# Patient Record
Sex: Male | Born: 1940
Health system: Southern US, Community
[De-identification: ages and names within clinical notes are randomized; demographics above are authoritative.]

## PROBLEM LIST (undated history)

## (undated) DIAGNOSIS — C61 Malignant neoplasm of prostate: Secondary | ICD-10-CM

## (undated) DIAGNOSIS — E119 Type 2 diabetes mellitus without complications: Secondary | ICD-10-CM

## (undated) DIAGNOSIS — K219 Gastro-esophageal reflux disease without esophagitis: Secondary | ICD-10-CM

## (undated) DIAGNOSIS — M1990 Unspecified osteoarthritis, unspecified site: Secondary | ICD-10-CM

## (undated) DIAGNOSIS — H409 Unspecified glaucoma: Secondary | ICD-10-CM

## (undated) DIAGNOSIS — I10 Essential (primary) hypertension: Secondary | ICD-10-CM

## (undated) DIAGNOSIS — I639 Cerebral infarction, unspecified: Secondary | ICD-10-CM

## (undated) DIAGNOSIS — C679 Malignant neoplasm of bladder, unspecified: Secondary | ICD-10-CM

## (undated) HISTORY — DX: Essential (primary) hypertension: I10

## (undated) HISTORY — DX: Malignant neoplasm of prostate: C61

## (undated) HISTORY — DX: Type 2 diabetes mellitus without complications: E11.9

## (undated) HISTORY — DX: Malignant neoplasm of bladder, unspecified: C67.9

## (undated) HISTORY — DX: Cerebral infarction, unspecified: I63.9

---

## 1994-04-22 HISTORY — PX: BACK SURGERY: SHX140

## 1998-04-22 HISTORY — PX: PROSTATE SURGERY: SHX751

## 1999-04-23 HISTORY — PX: COLONOSCOPY: SHX174

## 1999-12-31 ENCOUNTER — Other Ambulatory Visit: Admission: RE | Admit: 1999-12-31 | Discharge: 1999-12-31 | Payer: Self-pay | Admitting: Urology

## 2000-08-04 ENCOUNTER — Other Ambulatory Visit: Admission: RE | Admit: 2000-08-04 | Discharge: 2000-08-04 | Payer: Self-pay | Admitting: Urology

## 2000-08-04 ENCOUNTER — Encounter (INDEPENDENT_AMBULATORY_CARE_PROVIDER_SITE_OTHER): Payer: Self-pay | Admitting: Specialist

## 2001-01-12 ENCOUNTER — Other Ambulatory Visit: Admission: RE | Admit: 2001-01-12 | Discharge: 2001-01-12 | Payer: Self-pay | Admitting: Urology

## 2002-12-08 ENCOUNTER — Ambulatory Visit (HOSPITAL_COMMUNITY): Admission: RE | Admit: 2002-12-08 | Discharge: 2002-12-08 | Payer: Self-pay | Admitting: Internal Medicine

## 2002-12-08 HISTORY — PX: COLONOSCOPY: SHX174

## 2003-12-29 ENCOUNTER — Ambulatory Visit (HOSPITAL_COMMUNITY): Admission: RE | Admit: 2003-12-29 | Discharge: 2003-12-29 | Payer: Self-pay | Admitting: Family Medicine

## 2005-02-11 ENCOUNTER — Observation Stay (HOSPITAL_COMMUNITY): Admission: EM | Admit: 2005-02-11 | Discharge: 2005-02-13 | Payer: Self-pay | Admitting: Emergency Medicine

## 2005-02-12 ENCOUNTER — Ambulatory Visit: Payer: Self-pay | Admitting: *Deleted

## 2005-02-13 ENCOUNTER — Ambulatory Visit: Payer: Self-pay | Admitting: Cardiovascular Disease

## 2005-02-27 ENCOUNTER — Ambulatory Visit: Payer: Self-pay | Admitting: *Deleted

## 2005-04-01 ENCOUNTER — Encounter (INDEPENDENT_AMBULATORY_CARE_PROVIDER_SITE_OTHER): Payer: Self-pay | Admitting: Specialist

## 2005-04-01 ENCOUNTER — Ambulatory Visit (HOSPITAL_BASED_OUTPATIENT_CLINIC_OR_DEPARTMENT_OTHER): Admission: RE | Admit: 2005-04-01 | Discharge: 2005-04-01 | Payer: Self-pay | Admitting: Urology

## 2005-04-01 ENCOUNTER — Ambulatory Visit (HOSPITAL_COMMUNITY): Admission: RE | Admit: 2005-04-01 | Discharge: 2005-04-01 | Payer: Self-pay | Admitting: Urology

## 2005-06-10 ENCOUNTER — Encounter (INDEPENDENT_AMBULATORY_CARE_PROVIDER_SITE_OTHER): Payer: Self-pay | Admitting: Specialist

## 2005-06-10 ENCOUNTER — Inpatient Hospital Stay (HOSPITAL_COMMUNITY): Admission: RE | Admit: 2005-06-10 | Discharge: 2005-06-13 | Payer: Self-pay | Admitting: Urology

## 2005-09-12 ENCOUNTER — Ambulatory Visit: Admission: RE | Admit: 2005-09-12 | Discharge: 2005-12-11 | Payer: Self-pay | Admitting: *Deleted

## 2005-09-26 ENCOUNTER — Encounter (HOSPITAL_COMMUNITY): Admission: RE | Admit: 2005-09-26 | Discharge: 2005-12-25 | Payer: Self-pay | Admitting: *Deleted

## 2005-10-09 ENCOUNTER — Ambulatory Visit (HOSPITAL_COMMUNITY): Admission: RE | Admit: 2005-10-09 | Discharge: 2005-10-09 | Payer: Self-pay | Admitting: *Deleted

## 2005-11-10 ENCOUNTER — Emergency Department (HOSPITAL_COMMUNITY): Admission: EM | Admit: 2005-11-10 | Discharge: 2005-11-10 | Payer: Self-pay | Admitting: Emergency Medicine

## 2006-09-12 ENCOUNTER — Ambulatory Visit (HOSPITAL_COMMUNITY): Admission: RE | Admit: 2006-09-12 | Discharge: 2006-09-12 | Payer: Self-pay | Admitting: Urology

## 2006-09-12 ENCOUNTER — Encounter (INDEPENDENT_AMBULATORY_CARE_PROVIDER_SITE_OTHER): Payer: Self-pay | Admitting: Urology

## 2007-01-02 ENCOUNTER — Ambulatory Visit (HOSPITAL_BASED_OUTPATIENT_CLINIC_OR_DEPARTMENT_OTHER): Admission: RE | Admit: 2007-01-02 | Discharge: 2007-01-02 | Payer: Self-pay | Admitting: Urology

## 2007-01-02 ENCOUNTER — Encounter (INDEPENDENT_AMBULATORY_CARE_PROVIDER_SITE_OTHER): Payer: Self-pay | Admitting: Urology

## 2007-01-22 IMAGING — PT NM PET TUM IMG SKULL BASE T - THIGH
4 series · 25 of 25 positions shown · non-contrast
Comparison: None.

CLINICAL DATA: Prostate carcinoma.  Right adrenal mass seen on prior ProstaScint scan.  
FDG PET-CT TUMOR IMAGING (SKULL BASE TO THIGHS):
Fasting Blood Glucose:  95
TECHNIQUE: 18.2 mCi F-18 FDG were administered via right arm.  Full ring PET imaging was performed from the skull base through the mid-thighs 60 minutes after injection.  CT data was obtained and used for attenuation correction and anatomic localization only.  (This was not acquired as a diagnostic CT examination.)

[Series 1: pet ac · axial · 3.3mm · 4.69mm/px · z∈[-870,+0]mm · 9 of 267 slices shown]
[im 1/267]
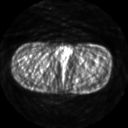
[im 34/267]
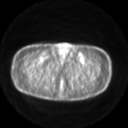
[im 67/267]
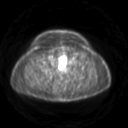
[im 100/267]
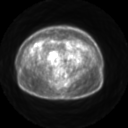
[im 134/267]
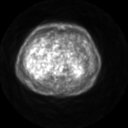
[im 167/267]
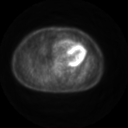
[im 200/267]
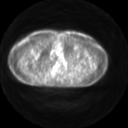
[im 233/267]
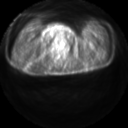
[im 267/267]
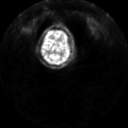

[Series 2: pet nac · axial · 3.3mm · 4.69mm/px · z∈[-870,+0]mm · 8 of 267 slices shown]
[im 1/267]
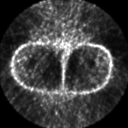
[im 39/267]
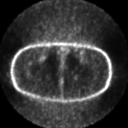
[im 77/267]
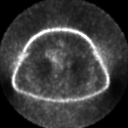
[im 115/267]
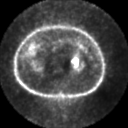
[im 153/267]
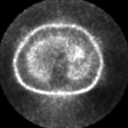
[im 191/267]
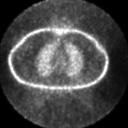
[im 229/267]
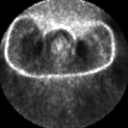
[im 267/267]
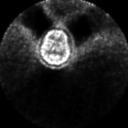

[Series 2: ct images · axial · 3.8mm · 0.98mm/px · z∈[-867,+0]mm · 7 of 235 slices shown]
[im 1/235]
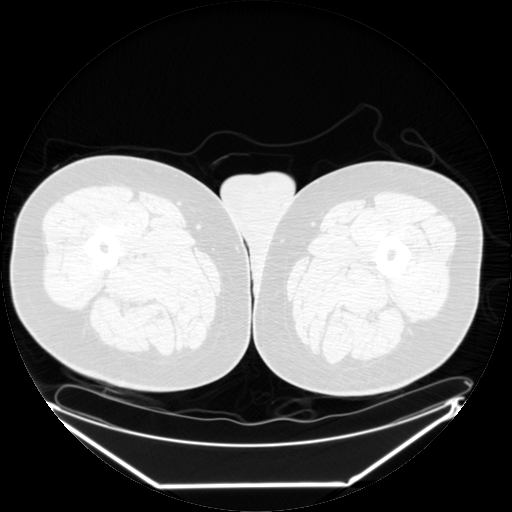
[im 40/235]
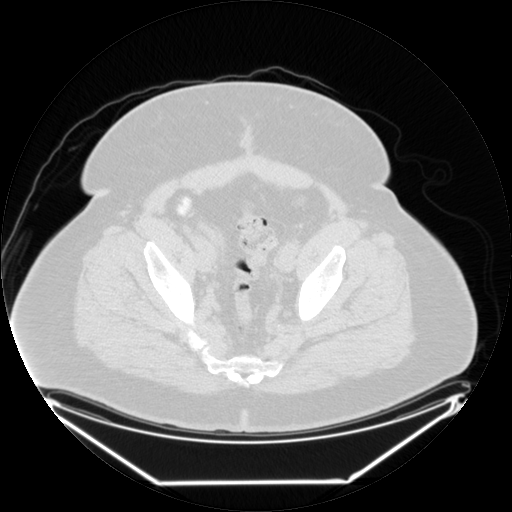
[im 79/235]
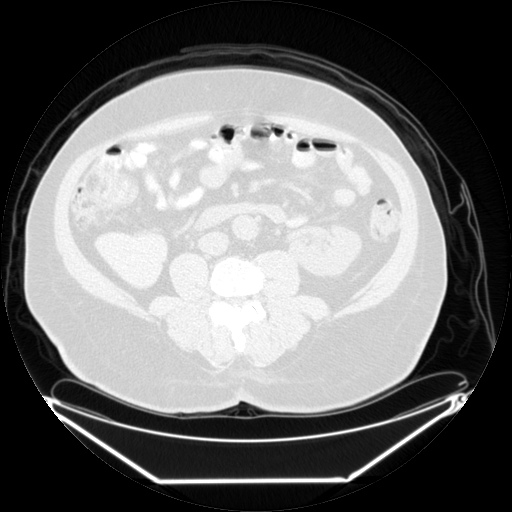
[im 118/235]
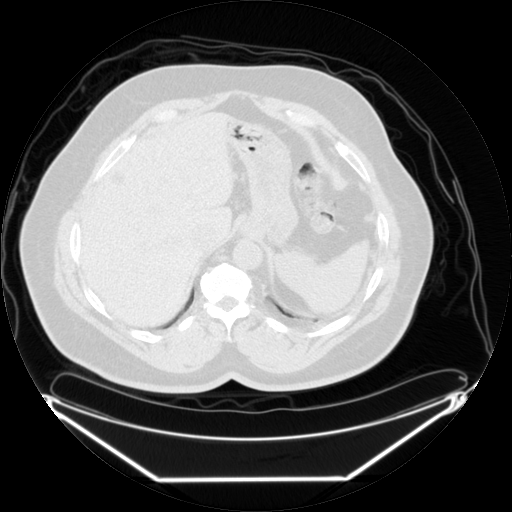
[im 157/235]
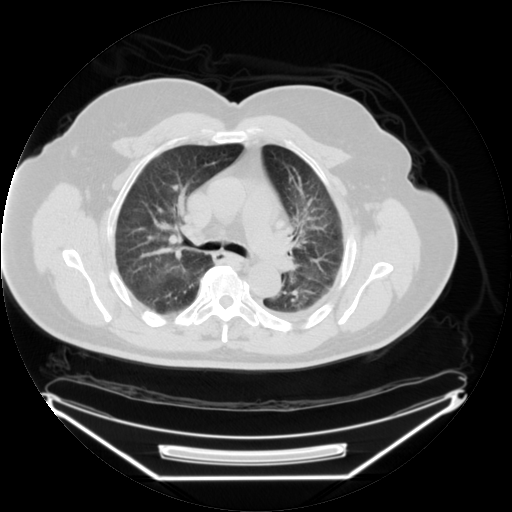
[im 196/235  brain]
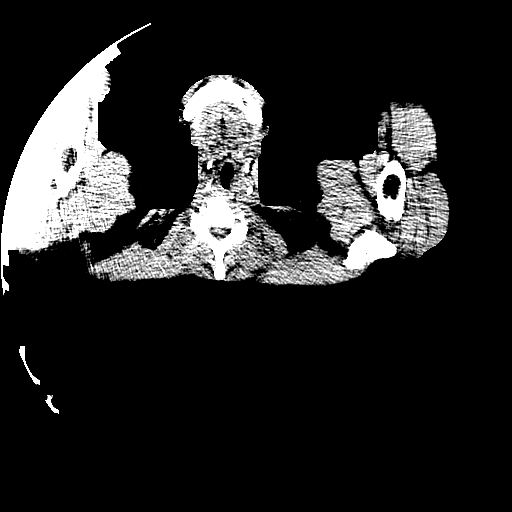
[im 235/235  brain]
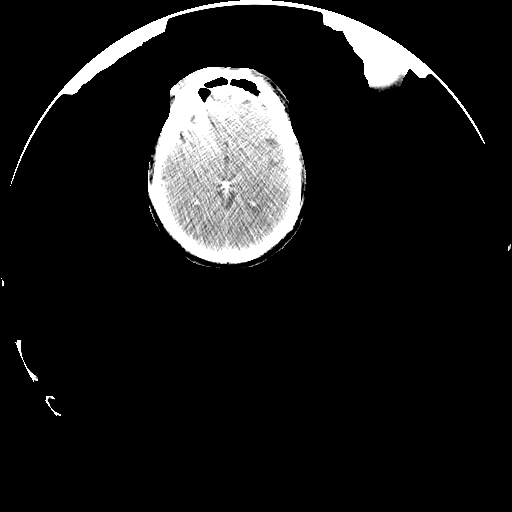

[Series 123: mip · coronal · 3.3mm · 4.69mm/px · 1 of 30 slices shown]
[im 1/30]
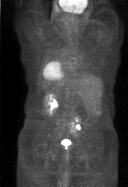

[25 of 25 positions shown; findings below may reference images not displayed]

FINDINGS: Motion artifact on this study limits evaluation of the neck, but no definite abnormal sites of FDG uptake are identified.  No abnormal sites FDG uptake are identified within the chest, abdomen, or pelvis to suggest the presence of malignancy.  Specifically, no abnormal FDG uptake is seen within the right adrenal mass noted on prior ProstaScint scan suggesting this represents a benign adrenal adenoma.  Incidental note is made of a right pelvic kidney.
IMPRESSION: 1.  Negative PET scan.  No evidence of malignancy.  
2.  Right pelvic kidney incidentally noted.

## 2007-05-01 ENCOUNTER — Encounter (INDEPENDENT_AMBULATORY_CARE_PROVIDER_SITE_OTHER): Payer: Self-pay | Admitting: Urology

## 2007-05-01 ENCOUNTER — Ambulatory Visit (HOSPITAL_BASED_OUTPATIENT_CLINIC_OR_DEPARTMENT_OTHER): Admission: RE | Admit: 2007-05-01 | Discharge: 2007-05-01 | Payer: Self-pay | Admitting: Urology

## 2007-06-21 DIAGNOSIS — I639 Cerebral infarction, unspecified: Secondary | ICD-10-CM

## 2007-06-21 HISTORY — DX: Cerebral infarction, unspecified: I63.9

## 2007-12-04 ENCOUNTER — Ambulatory Visit: Payer: Self-pay | Admitting: Internal Medicine

## 2007-12-23 ENCOUNTER — Ambulatory Visit: Payer: Self-pay | Admitting: Internal Medicine

## 2007-12-23 ENCOUNTER — Ambulatory Visit (HOSPITAL_COMMUNITY): Admission: RE | Admit: 2007-12-23 | Discharge: 2007-12-23 | Payer: Self-pay | Admitting: Internal Medicine

## 2007-12-23 ENCOUNTER — Encounter: Payer: Self-pay | Admitting: Internal Medicine

## 2007-12-23 HISTORY — PX: COLONOSCOPY: SHX174

## 2008-05-06 ENCOUNTER — Emergency Department (HOSPITAL_COMMUNITY): Admission: EM | Admit: 2008-05-06 | Discharge: 2008-05-07 | Payer: Self-pay | Admitting: Emergency Medicine

## 2008-07-29 ENCOUNTER — Inpatient Hospital Stay (HOSPITAL_COMMUNITY): Admission: EM | Admit: 2008-07-29 | Discharge: 2008-08-02 | Payer: Self-pay | Admitting: Emergency Medicine

## 2008-07-29 ENCOUNTER — Ambulatory Visit: Payer: Self-pay | Admitting: Cardiology

## 2008-08-01 ENCOUNTER — Encounter: Payer: Self-pay | Admitting: Neurology

## 2008-08-02 ENCOUNTER — Ambulatory Visit: Payer: Self-pay | Admitting: Physical Medicine & Rehabilitation

## 2008-08-02 ENCOUNTER — Ambulatory Visit: Payer: Self-pay | Admitting: Internal Medicine

## 2008-08-02 ENCOUNTER — Inpatient Hospital Stay (HOSPITAL_COMMUNITY)
Admission: RE | Admit: 2008-08-02 | Discharge: 2008-08-17 | Payer: Self-pay | Admitting: Physical Medicine & Rehabilitation

## 2008-08-13 ENCOUNTER — Ambulatory Visit: Payer: Self-pay | Admitting: Physical Medicine & Rehabilitation

## 2008-08-22 ENCOUNTER — Encounter (HOSPITAL_COMMUNITY)
Admission: RE | Admit: 2008-08-22 | Discharge: 2008-09-30 | Payer: Self-pay | Admitting: Physical Medicine & Rehabilitation

## 2008-09-16 ENCOUNTER — Encounter
Admission: RE | Admit: 2008-09-16 | Discharge: 2008-12-15 | Payer: Self-pay | Admitting: Physical Medicine & Rehabilitation

## 2008-09-22 ENCOUNTER — Ambulatory Visit: Payer: Self-pay | Admitting: Physical Medicine & Rehabilitation

## 2008-10-04 ENCOUNTER — Encounter (HOSPITAL_COMMUNITY)
Admission: RE | Admit: 2008-10-04 | Discharge: 2008-11-03 | Payer: Self-pay | Admitting: Physical Medicine & Rehabilitation

## 2008-11-01 ENCOUNTER — Ambulatory Visit: Payer: Self-pay | Admitting: Physical Medicine & Rehabilitation

## 2009-01-30 ENCOUNTER — Encounter
Admission: RE | Admit: 2009-01-30 | Discharge: 2009-03-22 | Payer: Self-pay | Admitting: Physical Medicine & Rehabilitation

## 2009-01-31 ENCOUNTER — Ambulatory Visit: Payer: Self-pay | Admitting: Physical Medicine & Rehabilitation

## 2009-03-21 ENCOUNTER — Ambulatory Visit: Payer: Self-pay | Admitting: Physical Medicine & Rehabilitation

## 2010-08-01 LAB — URINALYSIS, ROUTINE W REFLEX MICROSCOPIC
Bilirubin Urine: NEGATIVE
Glucose, UA: NEGATIVE mg/dL
Hgb urine dipstick: NEGATIVE
Ketones, ur: NEGATIVE mg/dL
Nitrite: NEGATIVE
Protein, ur: NEGATIVE mg/dL
Specific Gravity, Urine: 1.023 (ref 1.005–1.030)
Urobilinogen, UA: 1 mg/dL (ref 0.0–1.0)
pH: 5 (ref 5.0–8.0)

## 2010-08-01 LAB — BASIC METABOLIC PANEL
BUN: 11 mg/dL (ref 6–23)
CO2: 30 mEq/L (ref 19–32)
CO2: 30 mEq/L (ref 19–32)
CO2: 30 mEq/L (ref 19–32)
Calcium: 9.1 mg/dL (ref 8.4–10.5)
Calcium: 8.6 mg/dL (ref 8.4–10.5)
Calcium: 9 mg/dL (ref 8.4–10.5)
Chloride: 107 mEq/L (ref 96–112)
Creatinine, Ser: 0.98 mg/dL (ref 0.4–1.5)
Creatinine, Ser: 0.86 mg/dL (ref 0.4–1.5)
GFR calc Af Amer: >60 mL/min (ref >60)
GFR calc Af Amer: >60 mL/min (ref >60)
GFR calc Af Amer: >60 mL/min (ref >60)
GFR calc non Af Amer: >60 mL/min (ref >60)
GFR calc non Af Amer: >60 mL/min (ref >60)
Glucose, Bld: 103 mg/dL — ABNORMAL HIGH (ref 70–99)
Potassium: 4.2 mEq/L (ref 3.5–5.1)
Sodium: 143 mEq/L (ref 135–145)
Sodium: 141 mEq/L (ref 135–145)

## 2010-08-01 LAB — COMPREHENSIVE METABOLIC PANEL
ALT: 15 U/L (ref 0–53)
ALT: 15 U/L (ref 0–53)
AST: 17 U/L (ref 0–37)
AST: 17 U/L (ref 0–37)
Albumin: 3 g/dL — ABNORMAL LOW (ref 3.5–5.2)
Albumin: 3.5 g/dL (ref 3.5–5.2)
Alkaline Phosphatase: 63 U/L (ref 39–117)
BUN: 10 mg/dL (ref 6–23)
BUN: 13 mg/dL (ref 6–23)
CO2: 29 mEq/L (ref 19–32)
CO2: 30 mEq/L (ref 19–32)
Calcium: 8.4 mg/dL (ref 8.4–10.5)
Calcium: 8.8 mg/dL (ref 8.4–10.5)
Chloride: 108 mEq/L (ref 96–112)
Creatinine, Ser: 0.92 mg/dL (ref 0.4–1.5)
GFR calc Af Amer: >60 mL/min (ref >60)
GFR calc non Af Amer: >60 mL/min (ref >60)
Glucose, Bld: 102 mg/dL — ABNORMAL HIGH (ref 70–99)
Glucose, Bld: 105 mg/dL — ABNORMAL HIGH (ref 70–99)
Potassium: 3.6 mEq/L (ref 3.5–5.1)
Sodium: 141 mEq/L (ref 135–145)
Total Bilirubin: 0.5 mg/dL (ref 0.3–1.2)
Total Bilirubin: 0.6 mg/dL (ref 0.3–1.2)
Total Protein: 6 g/dL (ref 6.0–8.3)
Total Protein: 6.6 g/dL (ref 6.0–8.3)

## 2010-08-01 LAB — DIFFERENTIAL
Basophils Absolute: 0 10*3/uL (ref 0.0–0.1)
Basophils Absolute: 0.1 10*3/uL (ref 0.0–0.1)
Basophils Relative: 1 % (ref 0–1)
Basophils Relative: 1 % (ref 0–1)
Eosinophils Absolute: 0.2 10*3/uL (ref 0.0–0.7)
Eosinophils Relative: 3 % (ref 0–5)
Eosinophils Relative: 3 % (ref 0–5)
Lymphocytes Relative: 20 % (ref 12–46)
Lymphocytes Relative: 20 % (ref 12–46)
Lymphocytes Relative: 22 % (ref 12–46)
Lymphs Abs: 1.3 10*3/uL (ref 0.7–4.0)
Lymphs Abs: 1.5 10*3/uL (ref 0.7–4.0)
Monocytes Absolute: 0.4 10*3/uL (ref 0.1–1.0)
Monocytes Absolute: 0.5 10*3/uL (ref 0.1–1.0)
Monocytes Absolute: 0.5 10*3/uL (ref 0.1–1.0)
Monocytes Relative: 7 % (ref 3–12)
Monocytes Relative: 8 % (ref 3–12)
Monocytes Relative: 8 % (ref 3–12)
Neutro Abs: 4.4 10*3/uL (ref 1.7–7.7)
Neutro Abs: 4.5 10*3/uL (ref 1.7–7.7)
Neutro Abs: 5.1 10*3/uL (ref 1.7–7.7)
Neutrophils Relative %: 67 % (ref 43–77)
Neutrophils Relative %: 70 % (ref 43–77)

## 2010-08-01 LAB — LIPID PANEL
Cholesterol: 109 mg/dL (ref 0–200)
HDL: 29 mg/dL — ABNORMAL LOW (ref >39)
LDL Cholesterol: 72 mg/dL (ref 0–99)
Total CHOL/HDL Ratio: 3.8 RATIO
Triglycerides: 40 mg/dL (ref <150)
VLDL: 8 mg/dL (ref 0–40)

## 2010-08-01 LAB — CBC
HCT: 38.3 % — ABNORMAL LOW (ref 39.0–52.0)
HCT: 41.5 % (ref 39.0–52.0)
Hemoglobin: 13.1 g/dL (ref 13.0–17.0)
Hemoglobin: 13.6 g/dL (ref 13.0–17.0)
Hemoglobin: 14.2 g/dL (ref 13.0–17.0)
MCHC: 34.1 g/dL (ref 30.0–36.0)
MCHC: 34.2 g/dL (ref 30.0–36.0)
MCHC: 34.3 g/dL (ref 30.0–36.0)
MCHC: 34.3 g/dL (ref 30.0–36.0)
MCV: 91.7 fL (ref 78.0–100.0)
MCV: 92.4 fL (ref 78.0–100.0)
Platelets: 183 10*3/uL (ref 150–400)
Platelets: 187 10*3/uL (ref 150–400)
RBC: 4.18 MIL/uL — ABNORMAL LOW (ref 4.22–5.81)
RBC: 4.32 MIL/uL (ref 4.22–5.81)
RBC: 4.49 MIL/uL (ref 4.22–5.81)
RBC: 4.49 MIL/uL (ref 4.22–5.81)
RDW: 16.1 % — ABNORMAL HIGH (ref 11.5–15.5)
RDW: 16.1 % — ABNORMAL HIGH (ref 11.5–15.5)
WBC: 6.3 10*3/uL (ref 4.0–10.5)
WBC: 6.9 10*3/uL (ref 4.0–10.5)
WBC: 7.7 10*3/uL (ref 4.0–10.5)

## 2010-08-01 LAB — TROPONIN I: Troponin I: 0.01 ng/mL (ref 0.00–0.06)

## 2010-08-01 LAB — TSH: TSH: 1.1 u[IU]/mL (ref 0.350–4.500)

## 2010-08-01 LAB — PROTIME-INR
INR: 1.1 (ref 0.00–1.49)
Prothrombin Time: 14.6 seconds (ref 11.6–15.2)

## 2010-08-01 LAB — CK TOTAL AND CKMB (NOT AT ARMC)
CK, MB: 3.2 ng/mL (ref 0.3–4.0)
Relative Index: 2.1 (ref 0.0–2.5)
Total CK: 149 U/L (ref 7–232)

## 2010-08-01 LAB — GLUCOSE, CAPILLARY: Glucose-Capillary: 81 mg/dL (ref 70–99)

## 2010-08-01 LAB — BRAIN NATRIURETIC PEPTIDE: Pro B Natriuretic peptide (BNP): <30 pg/mL (ref 0.0–100.0)

## 2010-08-01 LAB — URINE CULTURE
Colony Count: NO GROWTH
Culture: NO GROWTH

## 2010-08-01 LAB — APTT: aPTT: 33 seconds (ref 24–37)

## 2010-09-04 NOTE — Assessment & Plan Note (Signed)
A 70 year old male with a history of hypertension admitted to [facility] on July 29, 2008, with left-sided weakness.  MRI showed an  infarction in the posterior lentiform nucleus extending to the corona  radiata and posterior limb of the internal capsule.  MRA showed  atherosclerotic changes.  Carotid Dopplers were negative for internal  carotid artery stenosis.  Echocardiogram showed no embolic source.  He  was placed on aspirin and Plavix after Neurology consultation.  He has  gone through inpatient rehabilitation from August 02, 2008, to August 17, 2008.  He has gone home and stays with his wife and receives outpatient  therapy at [facility].  He has had no falls, no other  complications since going home.  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  He has not returned to driving.  No seizures.   PHYSICAL EXAMINATION:  VITAL SIGNS:  Blood pressure 160/67, pulse 53,  respirations 18, O2 sat 96% on room air.  GENERAL:  No acute distress.  NEUROLOGIC:  Mood and affect appropriate.  His gait, he is using a cane.  He is also able to walk without a cane short distances.  Mild foot drop  on the left side.  No equinovarus positioning.  He has motor strength  that is good in bilateral upper extremities.  In the left upper  extremity, it is graded as 4-/5 at the deltoid, 4 at the biceps and  triceps, and 4- at the grip.  He has mild dorsum of the hand swelling in  the left hand.  His left ankle dorsiflexion is 4/5, but otherwise 5/5 in  bilateral lower extremities.  Sensation is intact.   No evidence of left neglect.   IMPRESSION:  1. Right subcortical infarct with left hemiparesis, upper extremity      greater than lower extremity.  Making good recovery.  We will      continue his outpatient therapy mainly occupational therapy at this      point.  2. I will see him back in 1 month to assess for  return to driving.  In      the meantime, he can go back to riding [employer].   Medications and hypertension management per primary care.      Erick Colace, M.D.  Electronically Signed     AEK/MedQ  D:  09/22/2008 11:35:36  T:  09/23/2008 01:01:45  Job #:  [id number]   cc:   Tesfaye D. Felecia Shelling, MD  Fax: [phone]

## 2010-09-04 NOTE — Consult Note (Signed)
NAMEADIR, SCHICKER NO.:  [id number]   MEDICAL RECORD NO.:  [id number]          PATIENT TYPE:  IPS   LOCATION:  4039                         FACILITY:  [facility]   PHYSICIAN:  Pricilla Riffle, MD, FACCDATE OF BIRTH:  Apr 01, 1941   DATE OF CONSULTATION:  08/08/2008  DATE OF DISCHARGE:                                 CONSULTATION   IDENTIFICATION:  [name] is a 70 year old gentleman who was admitted  for rehab therapy for after a CVA (MRI at [facility] shows  infarct in the right subcortical region).   The patient has a long history of hypertension.  He has been treated  with Cardizem, lisinopril, and Catapres.  He was transferred to [facility] for rehab.  Nursing staff has noted that his heart rate has  been as low as in the 30s, mainly in the 40s-50s.  Cardizem has been  held for 2 days.  Heart rate is still in the 40s.  Blood pressure is  increased to 211/103.   The patient denies chest pain.  No shortness of breath.  No dizziness.  He falls asleep easily.  He has taken sleep study by his report in the  past, question of Dr. Sandria Manly greater than 10 years ago, told he needed  surgery, did not go back.   PAST MEDICAL HISTORY:  1. Hypertension .  2. Chronic sinus bradycardia.  3. History of tobacco use.  4. DJD.  5. Mild CAD in October 2006.  Cardiac catheterization showed a 20-30%      LAD, 30-40% D1, 30% circumflex.  RCA was nondominant.  There was a      30% lesion.  Blood pressure at that time was 195/100.  Slow heart      rate was noted to be in the low 50s at that time.  6. History of BPH.  7. History of prostate adenocarcinoma, status post prostatectomy.  8. Chronic back pain.  9. History of bladder cancer.  10.Dyslipidemia.   ALLERGIES:  None.   CURRENT MEDICATIONS:  1. Catapres 0.3 t.i.d., just increased.  2. Plavix 75 daily.  3. Lasix 60 mg daily.  4. Zocor 40.  5. Cardura 8.  6. Lovenox 40.  7. Lisinopril 40 b.i.d.  8. Aspirin  325.  9. Cardizem, just discontinued today.   SOCIAL HISTORY:  The patient lives in [place].  He is married.  He  has a history of tobacco use.  No EtOH.  He has 2 adult children.   FAMILY HISTORY:  Father died of kidney disease, had diabetes.  Mother  has Alzheimer's.   REVIEW OF SYSTEMS:  Notes fatigue, otherwise all systems reviewed,  negative to the above problem except as noted above.   PHYSICAL EXAMINATION:  GENERAL:  The patient is in no acute distress,  sitting in a wheelchair.  VITAL SIGNS:  Blood pressure 153-196/80-91 today, pulse ranging from 39-  54.  Temperature is 97.7.  O2 sat on room air is 96%.  HEENT:  Normocephalic and atraumatic.  EOMI.  Mucous membranes are  moist.  NECK:  No  JVD.  No bruits.  LUNGS:  Clear to auscultation.  CARDIAC:  Regular rate and rhythm.  S1 and S2.  No S3, S4, or murmurs.  ABDOMEN:  Supple and nontender.  No hepatomegaly.  No masses.  Normal  bowel sounds.  EXTREMITIES:  Good distal pulses.  No lower extremity edema.  NEURO:  Moving all extremities.  The left and arm and leg are weak.  Otherwise deferred.   IMPRESSION:  1. Bradycardia, not hemodynamically destabilizing as the patient is      hypertensive.  Note diltiazem stopped, we would not increase      clonidine as it may slow heart rate further.  Check TSH.  2. Hypertension.  Add Norvasc 5 b.i.d.  Keep clonidine 0.2 t.i.d. for      now.  Decrease lisinopril to daily, usually not b.i.d. dosing.      Keep Lasix on.  He has had a history of sleep apnea diagnosed in      the past.  This could be exacerbating.  We will need to retest as      an outpatient.  3. Mild coronary artery disease, aspirin.  4. Health care maintenance.  Check lipids, on Zocor.   ADDENDUM:  Hydralazine could be added if needed for blood pressures.      Pricilla Riffle, MD, [facility]  Electronically Signed     PVR/MEDQ  D:  08/08/2008  T:  08/09/2008  Job:  [id number]

## 2010-09-04 NOTE — H&P (Signed)
[name], [name]                ACCOUNT NO.:  [id number]   MEDICAL RECORD NO.:  [id number]           PATIENT TYPE:  AMB   LOCATION:  DAY                           FACILITY:  [facility]   PHYSICIAN:  R. Roetta Sessions, M.D. DATE OF BIRTH:  1940-05-29   DATE OF ADMISSION:  DATE OF DISCHARGE:  LH                              HISTORY & PHYSICAL   REASON FOR VISIT:  Time for colonoscopy, history of polyps.   PHYSICIAN CO-SIGNING NOTE:  Jonathon Bellows, MD   HISTORY OF PRESENT ILLNESS:  Mr. [name] is a pleasant 70 year old  gentleman who has a history of tubulovillous adenoma.  At the time of  his 2001 colonoscopy, he had tubulovillous adenoma with focal high-grade  dysplasia.  His last colonoscopy was in 2004, and he had small internal  hemorrhoids but otherwise normal.  He was asked to follow up in 5 years.  Since I last saw him, he was diagnosed with prostate cancer and  underwent a radical prostatectomy followed by radiation therapy.  He  then had a primary bladder cancer diagnosed earlier this year and had  underwent treatment by Dr. Brunilda Payor.  He states the last time he had a  cystoscopy there is no evidence of cancer.  Otherwise, the patient  states he has been doing well.  He has had no problems.  He denies any  problems with his bowels or blood in the stool.  No problems with  heartburn.   CURRENT MEDICATIONS:  1. Cardura 8 mg daily.  2. Lasix 40 mg daily.  3. Cardizem 300 mg daily.  4. Aspirin 81 mg daily.   ALLERGIES:  No known drug allergies.   PAST MEDICAL HISTORY:  1. Hypertension.  2. History of prostate cancer and bladder cancer as outlined above.  3. Chronic back pain.  He has had back surgery in 1997 as well.  4. History of polyps as above.  5. He has also had a cardiac catheterization which he reports has been      unremarkable.   FAMILY HISTORY:  Mother has Alzheimer disease.  Father died at age 59  due to kidney problems.  He has 1 sister who died due to pneumonia  age  19.   SOCIAL HISTORY:  He is married.  He has 2 children.  He is retired.  He  occasionally smokes cigarettes.  He denies any alcohol use.   REVIEW OF SYSTEMS:  See HPI for GI.  CONSTITUTIONAL:  No unintentional weight loss.  CARDIOPULMONARY:  Denies chest pain or shortness of breath.  GENITOURINARY:  Denies any dysuria.   PHYSICAL EXAMINATION:  VITAL SIGNS:  Weight 263, height 5 feet 8 inches,  temperature 97.5, blood pressure 140/84, and pulse 60.  GENERAL:  Pleasant, obese black male, in no acute distress.  SKIN:  Warm and dry.  No jaundice.  HEENT:  Sclerae nonicteric.  Oropharyngeal mucosa moist and pink.  No  lesion, erythema, or exudate.  No lymphadenopathy or thyromegaly.  CHEST:  Lungs clear to auscultation.  CARDIAC:  Regular rate and rhythm.  Normal S1 and S2.  No murmurs, rubs,  or gallops.  ABDOMEN:  Positive bowel sounds, obese, soft, and nontender.  No  organomegaly or masses.  No rebound or guarding.  No abdominal bruits.  No hernias.  EXTREMITIES:  Lower Extremities, trace pedal edema bilaterally.   IMPRESSION:  Mr. [name] is a 70 year old gentleman with history of  tubulovillous adenoma with high-grade dysplasia in 2001.  He is due for  surveillance colonoscopy at this time.  His last colonoscopy in 2004 was  unremarkable.   PLAN:  1. Colonoscopy with Dr. Jena Gauss in the near future.  2. We will hold his aspirin 4 days prior to the procedure.      Tana Coast, P.AJonathon Bellows, M.D.  Electronically Signed    LL/MEDQ  D:  12/04/2007  T:  12/05/2007  Job:  [id number]   cc:   Donna Bernard, M.D.  Fax: [phone]

## 2010-09-04 NOTE — Op Note (Signed)
NAMEHAYZEN, LORENSON                ACCOUNT NO.:  [id number]   MEDICAL RECORD NO.:  [id number]          PATIENT TYPE:  AMB   LOCATION:  [facility]                         FACILITY:  [facility]   PHYSICIAN:  Lindaann Slough, M.D.  DATE OF BIRTH:  1941-01-08   DATE OF PROCEDURE:  01/02/2007  DATE OF DISCHARGE:                               OPERATIVE REPORT   PREOPERATIVE DIAGNOSIS:  Rule out recurrent bladder tumor.   POSTPROCEDURE DIAGNOSIS:  Rule out recurrent bladder tumor.   PROCEDURE:  Cystoscopy and bladder biopsy.   SURGEON:  Danae Chen, M.D.   ANESTHESIA:  General.   INDICATION:  The patient is 70 year old male who had bladder biopsy for  hematuria on Sep 12, 2006.  The bladder biopsy showed CIS.  He was then  treated with intravesical BCG/Interferon.  He is now scheduled for  cystoscopy and bladder biopsy.   SURGICAL TECHNIQUE:  Under general anesthesia, the patient was prepped  and draped and placed in the dorsal lithotomy position.  A panendoscope  was inserted in the bladder.  There are some reddened areas of the  posterior wall of the bladder.  There is no evidence of stone or tumor  in the bladder.  The ureteral orifices are in normal position and shape  with clear efflux.  Then, a random bladder biopsy was done and biopsy of  those reddened areas was also done.  Then, the areas of biopsy were then  fulgurated with the Bugbee electrode.  There is minimal bleeding at the  end of the procedure.  The cystoscope was removed.  A #16 Foley catheter  was then inserted in the bladder.  The patient tolerated the procedure  well and left the OR in satisfactory condition to the post anesthesia  care unit.      Lindaann Slough, M.D.  Electronically Signed     MN/MEDQ  D:  01/02/2007  T:  01/02/2007  Job:  [id number]

## 2010-09-04 NOTE — Op Note (Signed)
NAMEBURTIS, IMHOFF                ACCOUNT NO.:  [id number]   MEDICAL RECORD NO.:  [id number]          PATIENT TYPE:  AMB   LOCATION:  DAY                          FACILITY[facility]   PHYSICIAN:  Lindaann Slough, M.D.  DATE OF BIRTH:  11/05/1940   DATE OF PROCEDURE:  09/12/2006  DATE OF DISCHARGE:                               OPERATIVE REPORT   PREOPERATIVE DIAGNOSIS:  Hematuria, positive cytologies and carcinoma of  prostate.   POSTOPERATIVE DIAGNOSIS:  Hematuria, positive cytologies and carcinoma  of prostate.   PROCEDURE:  Cystoscopy, bilateral retrograde pyelograms. bladder and  renal pelvis washings for cytology and bladder biopsy.   SURGEON:  Danae Chen, M.D.   ANESTHESIA:  General.   INDICATIONS:  The patient is a 70 year old male who had a radical  prostatectomy for carcinoma of prostate.  He was subsequently treated  with external beam radiotherapy for elevated PSA postop.  He has been  having gross hematuria on and off.  CT scan showed a small  nonobstructing right renal stone and a right pelvic kidney.  Cystoscopy  showed no evidence of bladder stone or tumor.  NMP22 have been positive  and a FISH test was also positive.  The patient is scheduled today for  cystoscopy, retrograde pyelograms and bladder biopsy.   Under general anesthesia, the patient was prepped and draped and placed  in the dorsal lithotomy position.  A #22 Wappler panendoscope was  inserted in the bladder.  The patient had a radical prostatectomy but  the bladder neck is open.  The bladder mucosa is normal.  There is no  discrete bladder lesion.  There is no stone in the bladder.  The  ureteral orifices are in normal position and shape.  The cystoscopy was  done with both the Foroblique and the right angle lenses. The bladder  was then irrigated with normal saline and bladder washings were sent for  cytology.  Then a Glidewire was passed through a #6-French open ended  ureteral catheter and  passed through the cystoscope and into the left  ureteral orifice and the Glidewire was advanced up to the renal pelvis.  Then the open-ended catheter was advanced over the guidewire.  Then the  renal pelvis was irrigated with normal saline and washings from the left  renal pelvis was also sent for cytology.   Retrograde pyelogram.   Contrast was then injected through the open-ended catheter.  The renal  pelvis is normal.  There is no evidence of filling defect in the renal  pelvis.  Catheter was then gradually withdrawn in the ureter and  contrast was also injected through the open-ended catheter and there was  no evidence of filling defect in the ureter.  The open-ended catheter  was then removed.   A different Glidewire and ureteral catheter were then passed through the  cystoscope into the right ureter and the Glidewire was advanced into the  renal pelvis which is in the bony pelvis.  The Glidewire was removed.  The renal pelvis was irrigated with normal saline and washings for the  right renal pelvis were  also sent for cytology.   Right retrograde pyelogram.   Contrast was then injected through the open-ended catheter.  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  There is no evidence of  filling defect in the renal pelvis nor in the calyces.  The catheter was  then gradually withdrawn from the renal pelvis while injecting contrast  and there is no evidence of filling defect in the ureter.  Open-ended  catheter was then removed.   Then a random bladder biopsy was done with the cold cup biopsy forceps.  Then with the Bugbee electrode, the areas of biopsy were fulgurated.  There was no evidence of bleeding at the end of the procedure.  The  bladder was then emptied and the cystoscope removed.   The patient tolerated the procedure well and left the OR in satisfactory  condition to post anesthesia care unit.      Lindaann Slough, M.D.  Electronically  Signed     MN/MEDQ  D:  09/12/2006  T:  09/12/2006  Job:  [id number]

## 2010-09-04 NOTE — H&P (Signed)
NAMEHILBERT, [name]                ACCOUNT NO.:  [id number]   MEDICAL RECORD NO.:  [id number]          PATIENT TYPE:  IPS   LOCATION:  4039                         FACILITY:  [facility]   PHYSICIAN:  Ranelle Oyster, M.D.DATE OF BIRTH:  Aug 05, 1940   DATE OF ADMISSION:  08/02/2008  DATE OF DISCHARGE:                              HISTORY & PHYSICAL   PRIMARY CARE PHYSICIAN:  Tesfaye D. Felecia Shelling, MD   NEUROLOGIST:  Kofi A. Gerilyn Pilgrim, MD   CHIEF COMPLAINT:  Left-sided weakness.   HISTORY OF PRESENT ILLNESS:  This is a 70 year old African American male  with hypertension admitted to [facility] with decreased gait  and left-sided weakness on April 9.  An MRI revealed a positive infarct  in the posterior lentiform nucleus on the right extending into the  corona radiata, posterior limb of the internal capsule.  MRA was without  atherosclerotic changes.  Carotid Dopplers were negative.  Echocardiogram was unremarkable.  Neurology recommended aspirin and  Plavix.  Subcu Lovenox was initiated for DVT prophylaxis.  Blood  pressures have been monitored with Cardizem, Cardura, Lasix, lisinopril,  and clonidine on board.  The patient has been progressing with mobility  but still need a substantial assistance with function.  The rehab team  was consulted and within the last 24 hours has followed up and felt that  he would benefit from an inpatient rehab admission.   REVIEW OF SYSTEMS:  Notable for some low back pain.  He has had some  constipation.  He denies any shortness of breath, chest pain, urinary  frequency, etc.  Other pertinent positives are above and full reviews in  the written H and P.   PAST MEDICAL HISTORY:  Positive for:  1. Hypertension.  2. Morbid obesity.  3. Bladder and prostate cancer in 2007 status post radical      prostatectomy and XRT.  4. Chronic low back pain with back surgery in 1997.  5. Cardiac cath in 2006, unremarkable.  6. Positive tobacco.  Negative  alcohol use.   FAMILY HISTORY:  Positive for Alzheimer disease.   SOCIAL HISTORY:  The patient is married and retired.  Wife has permanent  pacemaker and is limited lifting.  He has his family close by who can  check on as needed.  He lives in 1-level house with 2 steps to enter.   ALLERGIES:  None.   HOME MEDICATIONS:  Cardura, Lasix, Cardizem, and aspirin.   LABORATORY DATA:  Hemoglobin 13.6, white count 6.9, and platelets 177.  Sodium 141, potassium 4.2, BUN 14, and creatinine 1.0.   PHYSICAL EXAMINATION:  VITAL SIGNS:  Blood pressure is 174/97, pulse is  45, respiratory rate 20, and temperature 97.8.  GENERAL:  The patient is generally pleasant, alert, and oriented x3.  HEENT:  Pupils equal, round, and reactive to light.  Ear, nose, and  throat exam is notable for fair dentition with pink moist mucosa.  NECK:  Supple without JVD or lymphadenopathy.  CHEST:  Clear to auscultation bilaterally without wheezes, rales, or  rhonchi.  HEART:  Regular rate and rhythm without murmur,  rubs, or gallops.  ABDOMEN:  Soft and nontender.  Bowel sounds are positive.  SKIN:  Generally intact without any signs breakdown.  NEUROLOGICAL:  Cranial nerves II through XII notable for a left central  VII and left tongue deviation.  The patient's speech is slightly  dysarthric, but clear otherwise.  Reflexes 1+ on the left arm and leg.  It is 1+ to 2+ in the right arm and leg today.  Sensation is decreased  at 1-2 left arm, intact left leg today.  Strength is essentially 2/5 at  the elbow, 1-2/5 left shoulder, trace at the wrist and fingers on the  left side.  Left lower extremity strength is 2+/5 at the knee with  extension and flexion, 2/5 with hip abduction, hip flexion 1/5, and  ankle dorsiflexion and plantar flexion zero to trace.  Judgment,  orientation, memory, and mood are all within functional limits.   POST ADMISSION PHYSICIAN EVALUATION:  1. Functional deficit secondary to right  subcortical infarct left      hemiparesis.  This is likely small vessel disease related and      hypertensive in etiology.  2. The patient is admitted to receive collaborative interdisciplinary      care between the physiatrist, rehab nursing staff, and therapy      team.  3. The patient's level of medical complexity and substantial therapy      needs in context of that medical necessity cannot be provided at a      lesser intensity of care.  4. The patient has experienced substantial functional loss from his      baseline.  The patient was independent prior to arrival.  Within      the last 24 hours, the patient has been mod assist 80 feet with      walker, min assist transfer bed to chair, and min assist with bed      mobility.  He has been min to max assist with upper to lower body      ADLs.  Judging by the patient's diagnosis, physical exam, and      functional history, he has a potential for functional progress      which will result in measurable gains while inpatient rehab.  These      gains will be of substantial and practical use upon discharge to      home in facilitating his mobility and self-care.  Interim changes      in medical status since preadmission screening are detailed in the      history of present illness above.  5. Physiatrist will provide 24-hour management of medical needs as      well as oversight of therapy plan/treatment and provide guidance as      appropriate regarding interaction of the two.  Medical problem list      and plan are listed below.  6. A 24-hour rehab nursing will assist in management of the patient's      bowel and bladder needs, particularly constipation today as well as      skin integrity, nutrition, safety awareness, medication      administration, and integration of therapy concepts and techniques.  7. PT will assess and treat for lower extremity strength and adaptive      equipment.  The patient likely will need AFO for the left  lower      extremity.  Also assess for functional mobility, gait, safety      awareness, neuromuscular reeducation,  etc.  Goals overall are      modified independent supervision.  8. OT will assess and treat for upper extremity use ADLs, adaptive      equipment, neuromuscular reeducation, adaptive techniques, family      education, safety awareness with goals supervision to modified      independent.  9. Speech Language Pathology will assess and treat for dysarthria with      modified independent goals.  10.Case management and social worker will assess and treat for      psychosocial issues and discharge planning.  11.Team conferences will be held weekly to assess progress towards      goals and to determine barriers to discharge.  12.The patient has demonstrated sufficient medical stability and      exercise capacity to tolerate at least 3 hours therapy per day at      least 5 days per week.  13.Estimated length of stay is two to two half weeks.  Prognosis is      good.   MEDICAL PROBLEM LIST AND PLAN:  1. Hypertension:  Continue clonidine, Cardizem, Cardura, Lasix, and      lisinopril.  Blood pressure still is elevated upon admission and      likely will need further adjustment of his medications.  2. History of bladder and prostate cancer.  The patient is voiding per      the chart.  We will check PVRs and follow for continence, etc.  3. Chronic low back pain.  We will use Tylenol for now.  This seems to      be under fair control.  4. Deep vein thrombosis prophylaxis.  Subcu Lovenox.  5. Stroke prophylaxis with aspirin and Plavix.  Watch blood counts,      signs and symptoms of bleeding, platelets, etc.      Ranelle Oyster, M.D.  Electronically Signed     ZTS/MEDQ  D:  08/02/2008  T:  08/03/2008  Job:  [id number]   cc:   Ninetta Lights D. Felecia Shelling, MD

## 2010-09-04 NOTE — Op Note (Signed)
NAMEBRISON, FIUMARA                ACCOUNT NO.:  [id number]   MEDICAL RECORD NO.:  [id number]          PATIENT TYPE:  AMB   LOCATION:  NESC                         FACILITY:  [facility]   PHYSICIAN:  Lindaann Slough, M.D.  DATE OF BIRTH:  [birth year]   DATE OF PROCEDURE:  05/01/2007  DATE OF DISCHARGE:                               OPERATIVE REPORT   PREOPERATIVE DIAGNOSIS:  Rule out recurrent bladder tumor.   POSTPROCEDURE DIAGNOSIS:  No recurrent bladder tumor.   PROCEDURE:  Cystoscopy, bladder biopsy and bladder washings for  cytology.   SURGEON:  Dr. Brunilda Payor.   ANESTHESIA:  General.   INDICATIONS:  The patient is 70 year old  male who had TUR bladder  biopsy in May 2008.  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  He was then treated with  intravesical BCG.  He has not had any hematuria since.  The bladder  biopsy in September 2008 showed no evidence of bladder tumor.  He is  scheduled now for cystoscopy and repeat bladder biopsy and bladder  washings.   DESCRIPTION OF PROCEDURE:  Under general anesthesia the patient was  prepped and draped and placed in the dorsolithotomy position.  A  panendoscope was inserted in the bladder.  The anterior urethra is  normal.  The patient had a radical prostatectomy and the bladder neck is  open. The bladder mucosa is normal.  There is no stone or tumor in the  bladder.  The ureteral orifices are in normal position and shape with  clear efflux.  The bladder was then irrigated with normal saline and  bladder washings were sent for cytology.  Then a random bladder biopsy  was done.  The areas of biopsy were then fulgurated with the Bugbee  electrode  There was no evidence of bleeding at the end of the  procedure.  The cystoscope was then removed.  A #16 Foley catheter was  then inserted in the bladder.   The patient tolerated the procedure well and left the OR in satisfactory  condition to post anesthesia care unit.      Lindaann Slough, M.D.  Electronically Signed     MN/MEDQ  D:  05/01/2007  T:  05/01/2007  Job:  [id number]

## 2010-09-04 NOTE — Discharge Summary (Signed)
NAMETRAYCE, MAINO                ACCOUNT NO.:  [id number]   MEDICAL RECORD NO.:  [id number]          PATIENT TYPE:  IPS   LOCATION:  4039                         FACILITY:  [facility]   PHYSICIAN:  Erick Colace, M.D.DATE OF BIRTH:  13-Feb-1941   DATE OF ADMISSION:  08/02/2008  DATE OF DISCHARGE:  08/17/2008                               DISCHARGE SUMMARY   DISCHARGE DIAGNOSES:  1. Right subcortical infarction with left hemiplegia.  2. Hypertension.  3. Hyperlipidemia.  4. History of bladder as well as prostate cancer.  5. Chronic low back pain.  6. Obesity.  7. Subcutaneous Lovenox for deep vein thrombosis prophylaxis.   This is a 70 year old black male with history of hypertension, who was  admitted to [facility] on July 29, 2008, with decreased in  ambulation and left-sided weakness.  MRI showed infarction posterior  lentiform nucleus on the right extending into the corona radiata and  posterior limb of the internal capsule.  MRA with atherosclerotic  changes.  Carotid Dopplers were negative for internal carotid artery  stenosis.  Echocardiogram with ejection fraction 75% without emboli.  Neurology Services was consulted, placed on aspirin and Plavix therapy.  Subcutaneous Lovenox for deep vein thrombosis prophylaxis.  Blood  pressure monitored with multiple antihypertensive medications.  Therapy  was evaluated on July 31, 2008.  He is independent with rolling in bed,  minimal assist sit to stand, and ambulating moderate assist 30 feet with  a walker.  He was admitted for a comprehensive rehab program.   PAST MEDICAL HISTORY:  See discharge diagnoses.  No alcohol.  He does  smoke approximately 0.5 to 1 pack per day.   SOCIAL HISTORY:  Married, 2 children.  Retired wife with permanent  pacemaker and limited lifting.  Family close by to check as needed.  One-  level home, 2-steps to entry.   FUNCTIONAL HISTORY PRIOR TO ADMISSION:  Independent.   FUNCTIONAL  STATUS UPON ADMISSION TO REHAB SERVICES:  Moderate assist 80  feet with a walker, minimal assist transfers bed to chair, and minimal  assist bed mobility.   MEDICATIONS PRIOR TO ADMISSION:  1. Cardura 8 mg daily.  2. Lasix 40 mg daily.  3. Cardizem 300 mg daily.  4. Aspirin 81 mg daily.   ALLERGIES:  None.   PHYSICAL EXAMINATION:  VITAL SIGNS:  Blood pressure 174/97, pulse 45,  temperature 97.8, and respirations 20.  NEUROLOGIC:  This was an alert male, in no acute distress, oriented x3.  Mild slurred speech, but fully intelligible.  Follows 3-step commands.  Deep tendon reflexes were hyperactive.  Calves remained cool without any  swelling, erythema, or nontender.  Sensation decreased to light touch in  the left.  LUNGS:  Clear to auscultation.  CARDIAC:  Regular rate and rhythm.  ABDOMEN:  Soft and nontender.  Good bowel sounds.   REHABILITATION HOSPITAL COURSE:  The patient was admitted to Inpatient  Rehab Services with therapies initiated on a 3-hour daily basis  consisting of physical therapy, occupational therapy, speech therapy,  and rehabilitation nursing.  The following issues were addressed during  the patient's rehabilitation stay.  Pertaining Mr. Smithson right  subcortical infarction with left hemiplegia, he remained on aspirin and  Plavix therapy.  Subcutaneous Lovenox ongoing for deep vein thrombosis  prophylaxis.  Blood pressures continued to have some increase in  variables with clonidine added as needed.  Bouts of bradycardia in the  mid 40s of which he was asymptomatic and extended diastolic pressures of  95-100.  His Cardizem was changed to 120 mg on August 05, 2008, Catapres  was increased to 0.3 mg 3 times daily on August 08, 2008, with Cardizem  discontinued.  [facility] was consulted with Norvasc added,  lisinopril decreased to 40 mg daily as Norvasc was again adjusted with  hydralazine added to 50 mg 3 times daily on August 12, 2008.  His  pressures  continued to normalize.  Latest blood pressure 157/78.  Would  follow up with his primary, Dr. Felecia Shelling, as well as [facility].  He remained on Zocor for hyperlipidemia.  He did have a  history of bladder and prostate cancer with radical prostatectomy in  2007 and radiation therapy.  This was without issue during his  rehabilitation stay.  Chronic low back pain with conservative care.  Again, he had no complaints during his hospital stay.  The patient  received weekly collaborative interdisciplinary team conferences to  discuss estimated length of stay, family teaching, and any barriers to  discharge.  He was overall minimal assist for bathing, simple set up for  upper body dressing, minimal assist lower body dressing, minimal assist  toilet transfers, and minimal assist transfers and ambulating 150 feet  with a cane.  He was able to communicate his needs without difficulty.  No unsafe behavior.  He was using his call Fabro.   LATEST LABORATORIES:  Hemoglobin 13.1, hematocrit 38.3, platelet of  182,000, and WBC 7.3.  Sodium 143, potassium 4.4, BUN 13, and creatinine  1.03.   DISCHARGE MEDICATIONS AT THE TIME OF DICTATION:  1. Zocor 40 mg at bedtime.  2. Cardura 8 mg daily.  3. Aspirin 325 mg daily.  4. Plavix 75 mg daily.  5. Lasix 60 mg daily.  6. Clonidine 0.2 mg 3 times daily.  7. Lisinopril 40 mg daily.  8. Norvasc 10 mg daily.  9. Hydralazine 50 mg 3 times daily.  10.Tylenol as needed.   ACTIVITY:  As tolerated.   DIET:  Regular diet.   SPECIAL INSTRUCTIONS:  No drinking, no driving, and no smoking.   FOLLOWUP:  Follow up with Dr. Felecia Shelling, Medical Management.    Cardiology Service to address antihypertensives needs.  Dr. Claudette Laws at the Outpatient Rehab Service Office as advised.      Mariam Dollar, P.A.      Erick Colace, M.D.  Electronically Signed    DA/MEDQ  D:  08/16/2008  T:  08/16/2008  Job:  [id number]   cc:    Tesfaye D. Felecia Shelling, MD  Lindaann Slough, M.D.  [facility]

## 2010-09-04 NOTE — Discharge Summary (Signed)
[name], [name]                ACCOUNT NO.:  [id number]   MEDICAL RECORD NO.:  [id number]          PATIENT TYPE:  INP   LOCATION:  A332                          FACILITY:  [facility]   PHYSICIAN:  Tesfaye D. Felecia Shelling, MD   DATE OF BIRTH:  May 27, 1940   DATE OF ADMISSION:  07/29/2008  DATE OF DISCHARGE:  04/13/2010LH                               DISCHARGE SUMMARY   DISCHARGE DIAGNOSES:  1. Right subcortical infarct.  2. Left-sided hemiplegia.  3. Hypertension.  4. History of cancer of the prostate.  5. Coronary artery disease.  6. Obesity.  7. Chronic back pain.   DISCHARGE MEDICATIONS:  1. Aspirin 325 mg daily.  2. Simvastatin 40 mg daily.  3. Cardura 8 mg daily.  4. Lasix 60 mg daily.  5. Cardizem CD 180 mg daily.  6. Lisinopril 40 mg b.i.d.  7. MiraLax 17 g daily.  8. Catapres 0.2 mg t.i.d.  9. Plavix 75 mg daily.   DISPOSITION:  The patient will be transferred to [facility].   HOSPITAL COURSE:  This is a 70 year old male  patient with history of  multiple medical illnesses, who was brought to emergency room due to  difficult to walk and left lower extremity weakness.  His MRI of the  brain showed acute infarct on the posterior lentiform nucleus and  posterior limb of internal capsule.  The patient was admitted and was  started on aspirin and also physical therapy.  The patient remained left-  sided hemiplegic and unable to ambulate.  Arrangement is being done for  acute rehab in the [facility].  The patient will be transferred  in stable condition.      Tesfaye D. Felecia Shelling, MD  Electronically Signed    TDF/MEDQ  D:  08/02/2008  T:  08/02/2008  Job:  [id number]

## 2010-09-04 NOTE — Group Therapy Note (Signed)
NAMETRAYDEN, BRANDY                ACCOUNT NO.:  [id number]   MEDICAL RECORD NO.:  [id number]          PATIENT TYPE:  INP   LOCATION:  A332                          FACILITY:  [facility]   PHYSICIAN:  Kofi A. Gerilyn Pilgrim, M.D. DATE OF BIRTH:  10-Feb-1941   DATE OF PROCEDURE:  DATE OF DISCHARGE:                                 PROGRESS NOTE   The patient reports that he had undergone physical therapy yesterday and  believes that his function and strength has improved.   PHYSICAL EXAMINATION:  VITAL SIGNS:  Temperature 96.0, pulse 42,  respirations 20, blood pressure 178/90.  GENERAL:  He is sitting up eating breakfast in bed.  He does have good  antigravity strength involving the left upper extremity and also today  involving the left leg which represents an improvement.   ASSESSMENT/PLAN:  Acute right subcortical infarct.  Continue with  physical therapy.  The patient has been placed on aspirin and Plavix  combination.  Again, I will just discontinue this combination for 3  months and then probably discontinue the aspirin and continue with  Plavix mono-therapy.  I am also going to continue to recommend physical  therapy.  Probably, he will need skilled nursing based upon his poor  physical therapy.  Echocardiogram is also suggested and a sleep study  evaluation.      Kofi A. Gerilyn Pilgrim, M.D.  Electronically Signed     KAD/MEDQ  D:  08/02/2008  T:  08/02/2008  Job:  [id number]

## 2010-09-04 NOTE — H&P (Signed)
[name], [name]                ACCOUNT NO.:  [id number]   MEDICAL RECORD NO.:  [id number]          PATIENT TYPE:  INP   LOCATION:  A332                          FACILITY:  [facility]   PHYSICIAN:  Tesfaye D. Felecia Shelling, MD   DATE OF BIRTH:  May 08, 1940   DATE OF ADMISSION:  07/29/2008  DATE OF DISCHARGE:  LH                              HISTORY & PHYSICAL   CHIEF COMPLAINT:  Difficult to walk and left lower extremity weakness.   HISTORY OF PRESENT ILLNESS:  This is a 70 years old male patient with  history of multiple medical illnesses who was brought to emergency room  due to the above complaints.  The patient was in his usual state of  health until early this morning.  When he woke up, the patient noticed  that he has weakness of his left lower extremity.  The patient then had  a problem balancing and walking.  He was brought to emergency room where  he was evaluated.  The patient had MRI, which showed acute infarct of  the posterior lentiform nucleus.  He had also extension to the posterior  corona radiata and the posterior limb of the internal capsule.  The  patient was then admitted for further evaluation and treatment.   REVIEW OF SYSTEMS:  No fever, chills, cough, chest pain, shortness of  breath, nausea, vomiting, abdominal pain, dysuria or urgency or  frequency of urination.   PAST MEDICAL HISTORY:  1. Hypertension.  2. CA of the bladder.  3. CA of the prostate.  4. Chronic back pain.  5. Obesity.   CURRENT MEDICATIONS:  1. Cardura 8 mg daily.  2. Lasix 40 mg daily.  3. Cardizem 300 mg daily.  4. Aspirin 81 mg daily.   SOCIAL HISTORY:  The patient is married.  He has 2 children.  The  patient is currently retired.  He has a history of old patient of  tobacco smoking.  No history of alcohol or substance abuse.   PHYSICAL EXAMINATION:  GENERAL:  The patient is alert, awake, and  resting.  VITAL SIGNS:  Blood pressure 163/82, pulse 51, respiratory rate 18,  temperature  98.6 degrees Fahrenheit.  HEENT:  Pupils are equal and reactive.  NECK:  Supple.  CHEST:  Clear lung fields, good air entry.  CARDIOVASCULAR:  First and second heart sounds heard.  No murmur, no  gallop.  ABDOMEN:  Soft and lax.  Bowel sounds positive.  No mass or  organomegaly.  EXTREMITIES:  No leg edema.  NEUROLOGIC:  The patient is alert, awake, and oriented x3.  There is no  sensory loss.  The patient has mild left lower extremity weakness.  However, the patient has difficulty balancing and walking.  His cranial  nerves are intact.  Reflexes are symmetrical.   ASSESSMENT:  1. Right internal capsule infarct.  2. Hypertension.  3. History of carcinoma of the prostate.  4. History of carcinoma of the bladder.  5. Obesity.   PLAN:  We will admit the patient under telemetry.  We will do neuro  check.  We will  continue the patient on aspirin.  We will do neurology  consult and we will continue current treatment and supportive care.      Tesfaye D. Felecia Shelling, MD  Electronically Signed     TDF/MEDQ  D:  07/29/2008  T:  07/30/2008  Job:  [id number]

## 2010-09-04 NOTE — Consult Note (Signed)
[name], [name]                ACCOUNT NO.:  [id number]   MEDICAL RECORD NO.:  [id number]          PATIENT TYPE:  INP   LOCATION:  A332                          FACILITY:  [facility]   PHYSICIAN:  Kofi A. Gerilyn Pilgrim, M.D. DATE OF BIRTH:  1940-11-12   DATE OF CONSULTATION:  DATE OF DISCHARGE:                                 CONSULTATION   REASON FOR CONSULTATION:  Left leg weakness.   The patient is a 70 year old [place] American man who presented with  acute onset of weakness involving the left lower extremity and also the  left upper extremity.  The patient does have a history of coronary  disease and has been on aspirin 81 mg for which he has been taking this  consistently.  No previous history of symptomatic strokes.   PAST MEDICAL HISTORY:  1. Coronary artery disease.  2. Prostate cancer.  3. Bladder cancer.  4. Hypertension.  5. Chronic low back pain.  6. Obesity.   ADMISSION MEDICATIONS:  1. Cardura 8 mg daily.  2. Lasix 40 mg daily.  3. Cardizem 300 mg daily.  4. Aspirin 81 mg daily.   SOCIAL HISTORY:  He is married although his wife apparently is ill at  home and needs some care herself.  He has 2 supportive children.  Currently retired.  Did smoke tobacco in the past, no alcohol or illicit  drug use.   REVIEW OF SYSTEMS:  As stated in the history of present illness,  otherwise unrevealing.   PHYSICAL EXAMINATION:  This shows an obese, pleasant man, no acute  distress.  Temperature 97.4, pulse __________, respirations 16, blood  pressure 163/88.  HEENT:  Neck is supple, head is normocephalic/atraumatic, does have high  tongue base - I suspect that he has increased risk of sleep apnea.  ABDOMEN:  Obese, soft.  EXTREMITIES:  No significant edema.  MENTATION:  Patient is awake, alert, he converses well, no dysarthria is  noted.  Speech, language and cognition are intact.  CRANIAL NERVES:  Pupils are equally round and reactive to light and  accommodation, visual  fields are intact, extraocular movements are full.  Facial muscles strength is symmetric, tongue midline, uvula midline.  Shoulder shrugs, movements impaired on the left side.  MOTOR:  Shows 3 to 5 strength involving the proximal left upper  extremity, distally 2/5.  The left lower extremity 2 to 3 over 5.  Right  side shows normal tone, bulk and strength.  Coordination shows no  dysmetria or tremors.  Reflexes are diminished in the left upper  extremity and the left leg, they are normal on the right side except  being absent at the ankle.  Both plantars or flexor sensation to  moderate temperature and light touch.   MRI of the brain is reviewed in person and shows acute infarct involving  the right lentiform nuclei seen on diffusing imaging which extends  somewhat to the coronary radiata.  There are old infarcts involving the  right thalamic area, left base of ganglia and the right inferior  cerebellum.  The MRA actually shows pretty good vessels, there  may be  some luminal irregularities in the ACA and MCA.  There appears to be an  azygus right PCA.  Sodium 141, potassium 3.6, chloride 108, CO2 - 29,  BUN 10, creatinine 0.9, glucose 102, liver enzymes normal.   ASSESSMENT AND PLAN:  1. Acute right subcortical infarct with significant left hemiparesis.  2. Hypertension.  3. History of coronary disease.  4. History of prostate cancer.  5. Likely obstructive sleep apnea syndrome.   RECOMMENDATIONS:  1. Agree with physical therapy, I did discuss with the physical      therapist.  She recommends skilled facility with rehab there as the      best option given his family situation.  2. Continue with aspirin, the dose has been increased.  I wanted to go      ahead and add Plavix.  This combination with aspirin should be done      for 3 months and then probably after that we can discontinue the      aspirin and continue only with Plavix mode of therapy.  3. Continue with blood pressure  control.  4. Evaluate for obstructive sleep apnea syndrome.      Kofi A. Gerilyn Pilgrim, M.D.  Electronically Signed     KAD/MEDQ  D:  08/01/2008  T:  08/01/2008  Job:  [id number]

## 2010-09-04 NOTE — Assessment & Plan Note (Signed)
[name] is a 70 year old male with history of hypertension admitted to  [facility] July 29, 2008, with left-sided weakness.  MRI showed  infarct in the posterior lentiform nucleus extending to the corona  radiata and posterior limb of the internal capsule.  Carotid Dopplers  were negative for internal carotid artery stenosis.  Echocardiogram  showed no embolic source.  MRA showed atherosclerotic changes and he was  therefore placed on aspirin and Plavix after neurology consultation.  Inpatient rehabilitation, April 13 through August 17, 2008.  Finished up  with outpatient therapy at [facility].  He has not returned to  driving yet.  His wife is concerned about him going back to driving.  His pain, he has some moderate pain in his left shoulder.  We discussed  treatment for this, likely CVA related.  He has had no trauma or falls.   He still needs some assistance with dressing, some depression at times.  Constipation.   SOCIAL HISTORY:  Married, lives with his wife.  Nonsmoker, nondrinker.   PHYSICAL EXAMINATION:  VITAL SIGNS:  Blood pressure 151/85, pulse 56,  respirations 18, O2 sat 96% room air.  GENERAL:  No acute stress.  Orientation x3.  Affect alert.  He aids with  a cane.  EXTREMITIES:  Coordination is normal in the right upper extremity.  In  the left upper extremity, decreased finger to thumb opposition.  Deep  tendon reflexes are hyperreflexic on the left side but at the right  side, sensation is intact.  His motor strength in the right upper  extremities 5/5 and the left upper extremity 4/5 at the deltoid, biceps,  triceps, and grip.  Left ankle dorsiflexor strength is 4/5, otherwise  5/5 bilateral lower extremities.  Gait shows widened base support.  No  evidence of toe drag, does use a cane for gait imbalance.   IMPRESSION:  Cerebrovascular accident with left hemiparesis, improving;  however, still has some significant balance issues.  I also suspect  that  he may have some reaction time issues and therefore do not feel  comfortable in just letting him return to drive without further  evaluation.   His options are either to go to the [facility] to be evaluated there or wait to  see me back in about 3 months at which time he should have pretty much  full physical recovery and we will see what his residual deficits are at  that time.  He is to continue his home exercise program outlined by  physical and occupational therapy.  He will follow up with his primary  MD in terms of stroke prophylaxis which include blood pressure  management and his Plavix.      [name], M.D.  Electronically Signed     AEK/MedQ  D:  11/01/2008 15:54:15  T:  11/02/2008 07:54:45  Job #:  [id number]   cc:   Joshua D. Felecia Shelling, MD  Fax: [phone]

## 2010-09-04 NOTE — Op Note (Signed)
NAMEWORLEY, RADERMACHER                ACCOUNT NO.:  [id number]   MEDICAL RECORD NO.:  [id number]          PATIENT TYPE:  AMB   LOCATION:  DAY                           FACILITY:  [facility]   PHYSICIAN:  R. Roetta Sessions, M.D. DATE OF BIRTH:  01-Apr-1941   DATE OF PROCEDURE:  12/23/2007  DATE OF DISCHARGE:                               OPERATIVE REPORT   PROCEDURE:  Colonoscopy and hot snare polypectomy.   INDICATIONS FOR PROCEDURE:  A 70 year old gentleman with history of  tubulovillous adenoma removed in 2001.  There was high-grade dysplasia  in the polyp.  Had essentially negative colonoscopy in 2004.  He is  devoid of any lower GI tract symptoms.  He comes for surveillance this  time.  Risks, benefits, and alternatives have been reviewed.  Questions  answered.  He is agreeable.  Please see documentation in the medical  record.   PROCEDURE NOTE:  O2 saturation, blood pressure, pulse, and respirations  were monitored throughout the entire procedure.   CONSCIOUS SEDATION:  Versed 5 mg IV and Demerol 75 mg IV in divided  doses.   INSTRUMENT:  Pentax video chip system.   FINDINGS:  Digital rectal exam revealed no abnormalities.  Endoscopic  findings:  The prep was adequate.  Colon:  Colonic mucosa was surveyed  from the rectosigmoid junction through the left transverse, right colon,  appendiceal orifice, ileocecal valve, and cecum.  These structures were  well seen and photographed for the record.  Terminal ileum was intubated  to 10 cm.  From this level, scope was slowly withdrawn.  All previously  mentioned mucosal surfaces were again seen.  The patient was noted to  have an 8-mm pedunculated polyp in the distal transverse colon.  It was  hot snared, removed, and recovered through the scope.  The remainder of  the colonic mucosa appeared normal.  The scope was pulled down the  rectum, where thorough examination of the rectal mucosa including  retroflexed view of the anal verge  demonstrated no rectal mucosal  abnormalities.  The patient tolerated the procedure well and was  reactive in Endoscopy.   IMPRESSION:  Normal rectum.  Pedunculated polyp, distal transverse  colon, status post hot snare removal.  Remainder of colonic mucosa and  terminal mucosa appeared normal.   RECOMMENDATIONS:  1. Follow up on path.  2. Further recommendations to follow.      Jonathon Bellows, M.D.  Electronically Signed     RMR/MEDQ  D:  12/23/2007  T:  12/24/2007  Job:  [id number]   cc:   Donna Bernard, M.D.  Fax: [phone]

## 2010-09-07 NOTE — Op Note (Signed)
   NAMESHONDALE, QUINLEY                          ACCOUNT NO.:  [id number]   MEDICAL RECORD NO.:  [id number]                   PATIENT TYPE:  AMB   LOCATION:  DAY                                  FACILITY:  [facility]   PHYSICIAN:  Lionel December, M.D.                 DATE OF BIRTH:  04-Dec-1940   DATE OF PROCEDURE:  12/08/2002  DATE OF DISCHARGE:                                 OPERATIVE REPORT   PROCEDURE:  Total colonoscopy.   INDICATIONS FOR PROCEDURE:  Mr. Schueller is a 70 year old African-American male  who had a large tubulovillous adenoma removed from his sigmoid colon in  January 2001. He is now returning for surveillance exam. He does not have  any GI symptoms. The procedure is reviewed with the patient and informed  consent was obtained.   PREOP MEDICATIONS:  Demerol 25 mg IV, Versed 4 mg IV.   FINDINGS:  Procedure performed in endoscopy suite. The patient's vital signs  and O2 saturations were monitored during the procedure and remained stable.  The patient was placed in the left lateral decubitus position,  rectal examination performed. No abnormality noted on external or digital  exam. The Olympus video scope was placed in the rectum and advanced under  direct vision into the sigmoid colon and beyond. Preparation was  satisfactory with some stool here and there which could be washed away. The  scope was easily passed through the cecum which was identified by the  appendiceal orifice and the ileocecal valve. Pictures taken for the record.  As the scope was withdrawn, the colonic mucosa was once again carefully  examined and was normal throughout. The rectal mucosa was also normal. The  scope was retroflexed to examine anal rectal junction and hemorrhoids were  noted above the dentate line. The endoscope was straightened and withdrawn.  The patient tolerated the procedure well.   Small internal hemorrhoids, otherwise, normal colonoscopy.   RECOMMENDATIONS:  He should continue  yearly Hemoccults and should return for  another colonoscopy five years from now.                                               Lionel December, M.D.    NR/MEDQ  D:  12/08/2002  T:  12/08/2002  Job:  [id number]   cc:   Donna Bernard, M.D.  [address]  [place]  [place] [address]  Fax: [phone]

## 2010-09-07 NOTE — Discharge Summary (Signed)
NAMEJOVIAN, [name]                ACCOUNT NO.:  [id number]   MEDICAL RECORD NO.:  [id number]          PATIENT TYPE:  INP   LOCATION:  2012                         FACILITY:  [facility]   PHYSICIAN:  Charlton Haws, M.D.     DATE OF BIRTH:  1940-07-20   DATE OF ADMISSION:  02/12/2005  DATE OF DISCHARGE:  02/13/2005                                 DISCHARGE SUMMARY   PRINCIPAL DIAGNOSIS:  Chest pain.   OTHER DIAGNOSES:  1.  Hypertension.  2.  Tobacco abuse.  3.  Obesity.  4.  Degenerative joint disease of the spine, status post lumbar surgery in      1997.  5.  History of colonoscopy with tubular villous adenoma which was resected      and subsequently a colonoscopy in 2004 which was normal.   ALLERGIES:  NO KNOWN DRUG ALLERGIES.   PROCEDURE:  Left heart cardiac catheterization.   HISTORY OF PRESENT ILLNESS:  A 70 year old African-American man with no  prior history of coronary disease with positive history of hypertension and  tobacco abuse, who presented to [facility] on February 12, 2005  following a three-day history of intermittent chest discomfort lasting  approximately 30 minutes radiating down the left arm with associated  palpitations.  He was treated with sublingual nitroglycerin with resolution  of chest discomfort in the ED at [facility] and was evaluated by  cardiology and the decision was made to transfer him to [facility]  for further evaluation.   HOSPITAL COURSE:  The patient ruled out for MI and underwent left heart  cardiac catheterization this morning revealing a nonobstructive coronary  disease with an EF of 60%.  He was hypertensive during catheterization  requiring IV enalapril x2 as well as IV hydralazine post catheterization.  Since his catheterization he has been ambulating without any recurrent chest  discomfort and his blood pressure has been steady in the 140s.  He is being  discharged home today in satisfactory condition with  plans to follow-up with  Dr. Gerda Diss and Dr. Dorethea Clan.   DISCHARGE LABS:  Hemoglobin 13.5, hematocrit 40.2, WBC 8.8, platelets 200,  MCV 92.7, sodium 142, potassium 3.9, chloride 110, CO2 29, BUN 12,  creatinine 1.0, glucose 85, PT 13.2, INR 1.0, total bilirubin 0.4, alkaline  phosphatase 61, AST 13, ALT 16, cardiac markers negative x2, total  cholesterol 151, triglyceride 99, HDL 32, LDL 99, calcium 8.3, TSH 0.780,  BNP less than 30.   DISPOSITION:  The patient is being discharged home today in good condition.   FOLLOWUP PLANS AND APPOINTMENTS:  The patient is asked to follow-up with his  primary care [name] Dr. Gerda Diss in [place] in 1-2 weeks.  He is to  follow-up with Dr. Vida Roller at the [facility]  [facility] on February 27, 2005 at 1:30 p.m.   DISCHARGE MEDICATIONS:  1.  Aspirin 81 mg daily.  2.  Cardura 8 mg daily.  3.  Lisinopril 10 mg daily.  4.  Zocor 40 mg q.h.s.  5.  Cardizem CD 300 mg daily.  6.  Lasix 40 mg daily.  7.  Potassium 20 mEq daily.   Outstanding lab studies none.  Duration of discharge encounter 30 minutes.      Joshua Anis, NP    ______________________________  Charlton Haws, M.D.    CRB/MEDQ  D:  02/13/2005  T:  02/14/2005  Job:  [id number]   cc:   Vida Roller, M.D.  Fax: [phone]   Scott A. Gerda Diss, MD  Fax: [phone]

## 2010-09-07 NOTE — H&P (Signed)
[name], [name]                ACCOUNT NO.:  [id number]   MEDICAL RECORD NO.:  [id number]          PATIENT TYPE:  INP   LOCATION:  A215                          FACILITY:  [facility]   PHYSICIAN:  Scott A. Gerda Diss, MD    DATE OF BIRTH:  1941/01/26   DATE OF ADMISSION:  02/11/2005  DATE OF DISCHARGE:  LH                                HISTORY & PHYSICAL   CHIEF COMPLAINT:  Chest discomfort.   HISTORY OF PRESENT ILLNESS:  This is a 70 year old male who has had  intermittent chest discomfort for the past 3 days. He describes it being  more substernal, more toward the left side, resolves with a slight sense of  pressure, tightness and discomfort that last for several minutes and then go  away.  He has had multiple spells Friday, Saturday and Sunday and came to  the ER today because he was having a spell.  He was given sublingual  nitroglycerin with improvement.  He denied breaking out in sweats.  He  denied nausea or vomiting with it.  He denied headaches.  He denies reflux  symptoms.  He denies abdominal pain or rectal bleeding.   PAST MEDICAL HISTORY:  The patient has a history of HTN, obesity, elevated  PSA followed by Dr. Brunilda Payor, urology.  Smokes about a pack a day.  Drinks only  occasionally.   FAMILY HISTORY:  Hypertension and diabetes.   ALLERGIES:  None.   MEDICATIONS:  1.  Cardura 8 mg daily.  2.  Furosemide 40 mg daily.  3.  Cardizem 300 mg daily.  4.  K-Tab 20 mEq 1 daily.   PHYSICAL EXAMINATION:  GENERAL:  NAD.  HEENT:  Benign.  TMs __________.  NECK:  Supple.  CHEST:  CTA.  HEART:  Regular.  ABDOMEN:  Soft.  EXTREMITIES:  No edema except for trace edema at the ankles.  SKIN:  Warm and dry.   LAB WORK:  Shows WBC 8.2, hemoglobin 14, sodium 140, potassium 3.8, BUN 16,  creatinine 1.  First two sets of cardiac enzymes negative.   ASSESSMENT/PLAN:  Chest discomfort - possible angina.  Given his size, I  really doubt a Cardiolite would be definitive.  Also given the  fact that he  has had discomfort at rest, I do not think he would tolerate a stress test.  I think this gentleman is probably going to need a catheterization with all  his risk factors.  We will go ahead and consult cardiology.  In addition to  this, we will use a beta blocker for his blood pressure to try to give some  anti-anginal effect as well, aspirin daily and hold on Lovenox.  Currently  Cardiology is to see the patient, and we will go forward from there.  The  patient understands that he needs to quit smoking, although it is doubtful  he is going to initiate that, although he states he is thinking about it  currently.      Scott A. Gerda Diss, MD  Electronically Signed     SAL/MEDQ  D:  02/11/2005  T:  02/11/2005  Job:  [id number]

## 2010-09-07 NOTE — Op Note (Signed)
NAMEHURMAN, KETELSEN                ACCOUNT NO.:  [id number]   MEDICAL RECORD NO.:  [id number]          PATIENT TYPE:  INP   LOCATION:  1408                         FACILITY:  [facility]   PHYSICIAN:  Lindaann Slough, M.D.  DATE OF BIRTH:  April 17, 1941   DATE OF PROCEDURE:  06/10/2005  DATE OF DISCHARGE:                                 OPERATIVE REPORT   PREOPERATIVE DIAGNOSIS:  Organ confined adenocarcinoma of the prostate.   POSTOPERATIVE DIAGNOSIS:  Organ confined adenocarcinoma of the prostate.   PROCEDURE:  Radical retropubic prostatectomy with bilateral pelvic lymph  node dissection.   SURGEON:  Dr. Su Grand.   ASSISTANT:  1.  Dr. Jethro Bolus.  2.  Dr. Glade Nurse.   ANESTHESIA:  General endotracheal.   SPECIMEN:  1.  Prostate with seminal vesicles.  2.  Bilateral pelvic lymph node dissection with left pelvic lymph node sent      for intraoperative frozen section.   DRAINS:  1.  10-French Blake drain in left lower quadrant.  2.  22-French Foley catheter to urinary bladder.   DESCRIPTION OF PROCEDURE:  The patient was identified by his wrist bracelet  and brought to room 11 where he received preprocedure antibiotics. He was  administered general anesthesia and he was prepped and draped in the usual  sterile fashion taking great care to minimize the risk of peripheral  neuropathy or compartment syndrome. Next, we made a 15 cm low midline  incision from the symphysis pubis extending upwards towards his umbilicus.  We dissected down through the dermis, subcutaneous fat, to the anterior  sheath. We identified the decussating fibers, they were split with Bovie  cautery. The medial aspect of the rectus bodies were identified and split  bluntly. We then divided the transversalis fascia with Bovie cautery and  developed our space of Retzius anteriorly and laterally up to the midway  portion of each psoas muscle. Next our Bookwalter retractor was set. Next we  began our  pelvic lymph node dissection on the left using the boundaries as  the take off of the hypogastric superiorly, the pelvic sidewall laterally,  the external iliac vein superiorly, the obturator nerve inferiorly and  Cooper's ligament distally. We removed all of the tissue within these  boundaries. There was one rather firm lymph node within this area. We  ligated and divided all of the attachments using Weck clips and sharp  dissection. We kept the obturator nerve in full view during the entire  dissection to ensure that it was not injured in the dissection. We did send  this node packet for frozen section secondary to the aforementioned  findings, it came back reactive tissue. Next we reset our retractor system  for the right obturator pelvic lymph node packet and we carried out the same  dissection as documented above. These nodes were sent for permanent section  as no obvious lymph node areas were noted. Next, using of blunt dissection,  we cleared the fat off of the dorsal venous complex. At this point, it was  noted the patient's anatomy was not favorable. He had a  rather deep pelvis  with a large osteophyte on the midline of his symphysis pubis. His pelvis  was also quite narrow. With a great deal of difficulty, we did visualize his  bilateral puboprostatic ligaments, these were taken down sharply. We then  developed a dissection plane bluntly between the dorsal venous complex and  the urethra. A Hoenfelter was placed through the space on a pass and we  ligated the dorsal venous complex with a 2-0 Vicryl. This procedure was  repeated an additional time. We then replaced the Hoenfelter retractor and  cut down onto it and dividing the dorsal venous complex with Bovie cautery.  No additional hemostasis was required in the dorsal venous complex. Next we  used a Stamey retractor to visualize the urethra at the apex of the  prostate. It was divided over the anterior half with a long knife on  a  handled. Next we delivered the Foley catheter into the field at a right-  angle. The valve stem was divided. The Foley catheter was clamped with a  Kelly clamp and it was brought into the field. Following this, we placed a  right-angle retractor posterior to the urethra between the urethra and the  rectum and the posterior urethra was divided sharply. Next, we divided the  rectourethralis which were visualized. This was done by sharp dissection. We  were then able to slightly develop the posterior plane however, we were not  able to develop this to the base. At this point, a decision was made to turn  to the bladder neck and start the dissection there because the patient's  deep pelvis made it difficult to isolate the prostatic pedicles. We divided  using Bovie cautery and sharp dissection the plane between the bladder neck  and the base. We identified the urethra which was opened anteriorly. The  Foley balloon was let down and the catheter was developed into the field to  isolate the urethra. Next we continued our dissection dividing the posterior  urethra. Next we divided our attachments at the bladder neck to the  prostatic base with Bovie cautery and we began our pedicle dissection  starting at the base and heading in an antegrade fashion using right-angle  dissection with Weck clips and sharp division. This was done towards the  apex of prostate. Again because of the patient's aforementioned anatomy,  when the prostate had been entirely freed up minus the vas and seminal  vesicles this dissection packet was taken by dividing it with a single large  Weck clip and sharp dissection and we did not dissect out the distal seminal  vesicle tips. Next the prostate was passed off the field. We did examine it  and we noted that the capsule had been violated posteriorly. Looking into  the field, we did see a large piece of the capsule which was still attached into the surgical field. We did  remove the majority of this with continued  sharp dissection.  Next, we inspected our bladder neck. The patient was  given indigo carmine and we visualized ureteral orifices bilaterally. We did  reconstruct the bladder neck using tennis racket approach with interrupted 2-  0 Vicryls. Next, we matured the bladder neck with interrupted 4-0 chromic at  the 10, 2, 4 and 8 o'clock positions everting the bladder mucosa. Next, we  turned our attention back to the prostatic fossa. Hemostasis was maintained  at several small venous oozing points with Bovie cautery. Next we placed a  [place] sound  and 4-0 Monocryls were placed on the urethra side of the  anastomosis at the 12, 2, 4, 8 and 10 o'clock positions. These were then  placed at the bladder neck in the corresponding position, the [place] was  removed. A 22-French Foley catheter was placed in the urethra and brought  into the bladder. The balloon was inflated, the retractor system was taken  down. Sponge and needle counts were noted to be correct. Next we completed  our anastomosis by tying the aforementioned sutures. Finally, we irrigated  the Foley catheter. It was noted to be watertight. Hemostasis was noted to  be excellent. Next, we placed a Blake drain in the left lower quadrant, we  secured it to the level of the skin with a 4-0 nylon. The skin was then  closed with a running #1 PDS. Dressings were applied. Foley catheter was  attached to the drainage bag. The JP was attached to the bulb. The patient  was reversed from his anesthesia which he tolerated without complication.  Please note Dr. Su Grand was present and participated in all aspects of  this case.    ______________________________  Glade Nurse, MD      Lindaann Slough, M.D.  Electronically Signed   MT/MEDQ  D:  06/10/2005  T:  06/11/2005  Job:  [id number]

## 2010-09-07 NOTE — Discharge Summary (Signed)
NAMELEIF, [name]                ACCOUNT NO.:  [id number]   MEDICAL RECORD NO.:  [id number]          PATIENT TYPE:  INP   LOCATION:  1408                         FACILITY:  [facility]   PHYSICIAN:  Lindaann Slough, M.D.  DATE OF BIRTH:  04-02-1941   DATE OF ADMISSION:  06/10/2005  DATE OF DISCHARGE:  06/13/2005                                 DISCHARGE SUMMARY   DISCHARGE DIAGNOSES:  Adenocarcinoma of prostate.   PROCEDURE:  Radical retropubic prostatectomy on June 10, 2005.   The patient is a 70 year old male who had a positive prostate biopsy  December 2007. Gleason score 6. Treatment options were discussed with the  patient and he chose to have radical prostatectomy and he was admitted on  February 19 for the procedure.   PHYSICAL EXAMINATION:  VITAL SIGNS:  Blood pressure was 180/90 on admission,  pulse 71, respirations 18, temperature 97.1 and weight 274 pounds.  LUNGS:  Clear.  HEART:  Regular.  ABDOMEN:  Protuberant. No CVA tenderness. Kidneys not palpable. No  hepatomegaly, no splenomegaly. Bowel sounds normal.  GENITALIA:  Penis uncircumcised, meatus normal. Scrotum normal.  RECTAL:  Sphincter tone was normal, prostate was enlarged and 40 g, seminal  vesicles not palpable.   Hemoglobin on admission was 14.1, hematocrit 41, WBC 8.8. Sodium 140,  potassium 3.6, BUN 11, creatinine 1.4. PT and PTT were within normal limits.  Urinalysis showed 0-2 RBCs, nitrite negative and urine culture was negative.  Chest x-ray showed no evidence of active disease. EKG showed sinus  bradycardia and nonspecific T wave abnormality.   The patient had a radical retropubic prostatectomy, bilateral pelvic lymph  node dissection on June 10, 2005. He had a difficult retropubic  prostatectomy because of his anatomy. His postop course was uneventful. He  remained afebrile and was started on a liquid diet the first day postop  which he tolerated well. The diet was gradually advanced and he had  bowel  movements June 12, 2005. On June 13, 2005, he had minimal drainage  from the [place] drain. The [place] drain was removed. He was then discharged  home on Cipro 500 mg twice a day, Vicodin 1 or 2 tablets PRN for pain.   CONDITION ON DISCHARGE:  Improved.   DIET:  Regular.   DISCHARGE INSTRUCTIONS:  The patient was instructed not to do any lifting,  straining or driving until further advised. He will return to the office in  1 week for skin staples removal. Will obtain a cystogram before removing the  Foley catheter in about 3 weeks.      Lindaann Slough, M.D.  Electronically Signed     MN/MEDQ  D:  06/13/2005  T:  06/14/2005  Job:  [id number]

## 2010-09-07 NOTE — Cardiovascular Report (Signed)
[name], [name]                ACCOUNT NO.:  [id number]   MEDICAL RECORD NO.:  [id number]          PATIENT TYPE:  INP   LOCATION:  2012                         FACILITY:  [facility]   PHYSICIAN:  Charlton Haws, M.D.     DATE OF BIRTH:  01-Apr-1941   DATE OF PROCEDURE:  DATE OF DISCHARGE:                              CARDIAC CATHETERIZATION   INDICATIONS:  A 70 year old patient with a coronary risk factors and chest  pain.   PROCEDURE:  Cine catheterization was done from the right femoral artery. The  patient was quite hypertensive during the case with systolic pressures close  to 200. He was given two doses of intravenous enalaprilat and nicardipine  will be started as a drip post-cath if his pressures remain elevated. His  heart rate was only in the low 50s and labetalol was not an option.   RESULTS:  1.  Left main coronary artery was normal.  2.  Left anterior descending artery had 20-30% multiple discrete lesions in      the proximal and mid vessel.  3.  First diagonal branch was small with 30-40% multiple discrete lesions.      The second diagonal branch was normal.  4.  The patient had a large left dominant circulation.  5.  The circumflex proper was normal. First obtuse marginal branch had 30%      multiple discrete lesions. The second obtuse marginal branch had 30%      multiple discrete lesions.  6.  Right coronary artery was small and nondominant. There was 30% tubular      disease in the mid vessel.  7.  RAO ventriculography was normal. EF was 60%. There was no gradient      across the aortic valve and no MR. Aortic pressure was 195/100. LV      pressure is 195/15.   IMPRESSION:  The patient needs better control of his blood pressure.  He  will be discharged later today so long as his groin heals well. He can  follow up with Dr. Sudie Bailey and Dr. Dorethea Clan regarding his blood pressure  medications. He does not have significant epicardial coronary disease and  has good LV  function.           ______________________________  Charlton Haws, M.D.     PN/MEDQ  D:  02/13/2005  T:  02/13/2005  Job:  [id number]   cc:   Lacy Duverney, M.D.  Fax: [phone]   [facility]/Dr. Dorethea Clan

## 2010-09-07 NOTE — H&P (Signed)
NAMEDOMONIQUE, BROUILLARD                            ACCOUNT NO.:  [id number]   MEDICAL RECORD NO.:  [id number]                  PATIENT TYPE:   LOCATION:                                       FACILITY:   PHYSICIAN:  Lionel December, M.D.                 DATE OF BIRTH:  12-Feb-1941   DATE OF ADMISSION:  DATE OF DISCHARGE:                                HISTORY & PHYSICAL   PRESENTING COMPLAINT:  History of polyps, interested in followup exam.   SUBJECTIVE:  [name] is a 70 year old African-American male patient of Dr.  Constance Haw, who underwent total colonoscopy on May 10, 1999, because  of heme-positive stools.  He was found to have over a 2 cm tubulovillous  adenoma with focal high-grade dysplasia at the sigmoid colon.  Margin was  clear.  He was advised on followup exam in three years, but he is overdue.  He states he is fine.  He denies melena, rectal bleeding, change in his  bowel habits, abdominal pain, anorexia, or weight loss.  He states he has  too good an appetite and is trying to lose weight.  He also denies  heartburn, dysphagia.  He has nocturia x2, but this is a chronic symptom.   MEDICATIONS:  1. Cardura 8 mg daily.  2. Lasix 40 mg daily.  3. Cardizem-CD 300 mg daily.   PAST MEDICAL HISTORY:  1. He has hypertension.  2. He has DJD of lumbar spine, presently on no medication.  He had lumbar     spine surgery in 1997.  3. GI history is as above.  4. He also has BPH.   ALLERGIES:  None known.   FAMILY HISTORY:  Mother is not doing well.  She has Alzheimer's disease.  Father died at age 49 in April 21, 2002.  He lost one sister of pneumonia at  age 59.  He has two sisters and two brothers who are in fair health.   SOCIAL HISTORY:  He is retired.  He is married.  He smokes about half a pack  per day which he has done for many years.  He drinks alcohol occasionally.  He has two children.   PHYSICAL EXAM:  GENERAL:  Pleasant, moderately-obese, African-American  male  who is in no acute distress.  He weighs 267 pounds.  He is 5 feet 7 inches  tall.  VITAL SIGNS:  Pulse 78 per minute.  Blood pressure 140/80.  HEENT:  Conjunctivae is pink.  Sclerae is nonicteric.  Oropharyngeal mucosa  is normal.  NECK:  Without masses or thyromegaly.  CARDEC:  Regular rhythm, normal S1 and S2.  No murmur or gallop noted.  LUNGS:  Clear to auscultation.  ABDOMEN:  Obese, but soft and nontender without organomegaly or masses.  RECTAL:  Examination reveals a large mound on the prostate, and his stool is  guaiac negative.  EXTREMITIES:  He has trace  edema around his ankles.   ASSESSMENT:  [name] is a 70 year old African-American male who had large  tubulovillous adenoma with high-grade dysplasia removed from the sigmoid  colon over three years ago.  He is overdue for surveillance colonoscopy.  He  presently does not have any gastrointestinal symptoms, and his stool is  guaiac-negative which is reassuring.   RECOMMENDATIONS:  Total colonoscopy to be performed at [facility] in near future.  I have reviewed the procedure with the patient and have answered his  questions.                                                Lionel December, M.D.    NR/MEDQ  D:  11/18/2002  T:  11/18/2002  Job:  [id number]   cc:   Donna Bernard, M.D.  [address]  [place]  [place] [address]  Fax: [phone]

## 2010-09-07 NOTE — Consult Note (Signed)
[name], [name] NO.:  [id number]   MEDICAL RECORD NO.:  [id number]          PATIENT TYPE:  INP   LOCATION:  A215                          FACILITY:  [facility]   PHYSICIAN:  Vida Roller, M.D.   DATE OF BIRTH:  March 04, 1941   DATE OF CONSULTATION:  02/12/2005  DATE OF DISCHARGE:                                   CONSULTATION   CARDIOLOGIST:  Sheridan Bing, M.D. [facility]   HISTORY OF PRESENT ILLNESS:  [name] is a 70 year old man who has a history  of obesity, tobacco abuse and hypertension who presents with discomfort in  his chest over the last three days, longest episode lasted about 30 minutes.  It radiates down his left arm.  It is associated with some palpitations.  It  does not occur at rest or with exertion, but occurs at sort of sporadic  times.  He does feel as though it gets worse if he exerts himself.  He came  to the emergency room with chest discomfort, was treated with sublingual  nitroglycerin, had resolution of his discomfort, and has had no discomfort  since then except for one short episode at 1:00 this morning.   MEDICATIONS PRIOR TO ADMISSION:  1.  Cardura 8 mg once daily.  2.  Lasix 40 mg once daily.  3.  Cardizem 300 mg once daily.  4.  K-Ciel 20 mEq once daily.   HOSPITAL MEDICATIONS:  1.  Aspirin.  2.  Atenolol 50 mg b.i.d.  3.  Cardura 8 mg daily.  4.  Lasix 40 mg daily.  5.  Prinivil 5 mg daily.  6.  K-Dur 20 mEq daily.   PAST MEDICAL HISTORY:  1.  Hypertension.  2.  Obesity.  3.  Tobacco abuse.  4.  DJD of his spine status post lumbar surgery in 1997.  5.  Colonoscopy with a tubular villous adenoma which was resected.  6.  Colonoscopy again in 2004 which was normal.   SOCIAL HISTORY:  He lives in [place] with his wife.  His son is active in  his healthcare.  He used to smoke but quit a couple of days ago.  He  occasionally drinks alcohol and uses no illicit drugs.   FAMILY HISTORY:  His mother is alive but has  Alzheimer's disease at an  advanced age.  His father died at age 42 of complications of heart disease.  He has one sister who died of pneumonia at age 56.   REVIEW OF SYSTEMS:  Denies any fever, chills, night sweats, weight change,  adenopathy.  No headaches, sinus tenderness.  No nasal discharge.  No  bleeding.  No voice changes.  No vertigo or photophobia.  No vision or  hearing loss.  No problems with his dentition.  He denies any skin lesions  or rash.  He denies any shortness of breath, PND, orthopnea, lowered  extremity edema, palpitations, presyncope, syncope, claudication, cough or  wheezing.  Denies any urinary frequency or urgency.  Denies any weakness or  numbness.  Denies any myalgias or arthralgias.  Denies any nausea, vomiting,  diarrhea, bright red blood per rectum, melena, hematochezia, dysphagia,  odynophagia, GERD symptoms.  He does occasionally get constipated.  No  polyuria or polydipsia, and the remainder of his review of systems is  negative.   PHYSICAL EXAMINATION:  VITAL SIGNS:  Weight 275 pounds, pulse 54,  respirations 20, blood pressure 162/81, afebrile.  GENERAL APPEARANCE:  Well-developed, well-nourished African American male in  no apparent distress.  Alert and oriented x4.  HEENT:  Normal.  NECK:  Supple with no jugular venous distention or carotid bruits.  No  adenopathy noted.  CHEST:  Lungs are clear to auscultation bilaterally.  CARDIAC:  Regular.  He has a soft 2/6 systolic murmur heard best in the left  lower sternal border.  First and second heart sound are normal.  He does  have an S4.  There is no diastolic murmur.  SKIN:  Without lesions.  BREAST/GU/RECTAL:  Deferred.  ABDOMEN:  Soft, nontender, but obese.  Normoactive bowel sounds.  No  hepatosplenomegaly.  No bruits noted.  EXTREMITIES:  Without cyanosis or clubbing.  He does have 1+ edema to the  mid calf.  NEUROLOGICAL:  Nonfocal.   STUDIES:  Chest x-ray is normal.  Electrocardiogram  shows sinus rhythm at  rate of 60 with a mild left axis deviation, normal intervals, nonspecific ST-  T wave changes.  No Q-waves.  No old EKG's for comparison.   LABORATORY DATA:  White blood cell count 8.2, H&H 14 and 41, platelets  227,000.  Sodium 139, potassium 3.7, chloride 106, bicarbonate 27, BUN 15,  creatinine 1.0, blood sugars 114.  Cardiac enzymes x2 are negative.  BNP  less than 30.   IMPRESSION:  This is a gentleman with chest discomfort which has some  elements of typical angina, and he has multiple cardiac risk factors.  My  thoughts are that he is probably better with a heart catheterization.  He  and his family would like a heart catheterization as opposed to a stress  test, and his primary doctor would also like him to have a heart  catheterization.  He has hypertension which is suboptimally controlled, and  this will need to be better controlled.  For his tobacco abuse, he needs to  continue his smoking cessation.  We are going to send him to [facility] for  heart catheterization.  We will transport him via [facility].  I think he  probably needs Lovenox, and I will add that to his medical therapy.      Vida Roller, M.D.  Electronically Signed     JH/MEDQ  D:  02/12/2005  T:  02/12/2005  Job:  [id number]

## 2010-09-07 NOTE — Op Note (Signed)
NAMEAB, LEAMING                ACCOUNT NO.:  [id number]   MEDICAL RECORD NO.:  [id number]          PATIENT TYPE:  AMB   LOCATION:  [facility]ESC                         FACILITY:  [facility]   PHYSICIAN:  Lindaann Slough, M.D.  DATE OF BIRTH:  1940/07/28   DATE OF PROCEDURE:  04/01/2005  DATE OF DISCHARGE:                                 OPERATIVE REPORT   PREOPERATIVE DIAGNOSIS:  Benign prostatic hypertrophy, elevated PSA.   POSTOPERATIVE DIAGNOSIS:  Benign prostatic hypertrophy, elevated PSA.   PROCEDURE:  Saturation biopsy of prostate.   SURGEON:  Danae Chen, M.D.   ANESTHESIA:  General.   INDICATIONS:  The patient is a 70 year old male who has a history of  elevated PSA and had four negative prostate biopsies in the past. His PSA  has continued to rise. It was 8.87 in November2006 with free PSA of 18.2%.  He is scheduled for saturation prostate biopsy.   Under general anesthesia, the patient was prepped and draped and placed in  the dorsal lithotomy position. The ultrasound transducer was then inserted  in the rectum. The grid was then attached  to the ultrasound probe. Then 14  biopsies of the right lobe of the prostate were done, then 12 biopsies of  the left lobe of the prostate was done. There was minimal bleeding at the  end of the procedure.   The ultrasound probe was then removed.   The patient tolerated the procedure well and left the OR in satisfactory  condition to post anesthesia care unit.      Lindaann Slough, M.D.  Electronically Signed     MN/MEDQ  D:  04/01/2005  T:  04/01/2005  Job:  [id number]

## 2010-09-07 NOTE — H&P (Signed)
NAMEMARCIAL, PLESS                ACCOUNT NO.:  [id number]   MEDICAL RECORD NO.:  [id number]          PATIENT TYPE:  INP   LOCATION:  0004                         FACILITY:  [facility]   PHYSICIAN:  Lindaann Slough, M.D.  DATE OF BIRTH:  1940/05/08   DATE OF ADMISSION:  06/10/2005  DATE OF DISCHARGE:                                HISTORY & PHYSICAL   CHIEF COMPLAINT:  Adenocarcinoma of the prostate.   HISTORY OF PRESENT ILLNESS:  The patient is a 70 year old male who had a  prostate biopsy done in December 2006.  The biopsy was positive for  adenocarcinoma, Gleason's score 6.  PSA at the time of diagnosis was 8.87.  Treatment options were discussed with the patient and he chose to have an  open radical prostatectomy.  He is admitted today for the procedure.   PAST MEDICAL HISTORY:  Positive for hypertension.   PAST SURGICAL HISTORY:  Back surgery in 1993.   MEDICATIONS:  1.  Cardizem 300 mg daily.  2.  Furosemide 40 mg daily.  3.  Doxazosin 8 mg daily.  4.  Lisinopril 10 mg daily a.m.  5.  Klor-Con 10/20 daily.  6.  Zocor 40 mg daily.   ALLERGIES:  No known drug allergies.   SOCIAL HISTORY:  Married.  Has two children.  Has smoked a pack a day for  about 27 seven years and drinks socially.   FAMILY HISTORY:  His father died of kidney failure at age 8.  His mother is  44 years old and he has two sisters and two brothers.   REVIEW OF SYSTEMS:  RESPIRATORY:  He has no cough, no shortness of breath,  no hemoptysis. CARDIOVASCULAR:  No palpitations, chest pain.  GI:  No nausea  and no vomiting.  No diarrhea or constipation.  GU:  He has urgency and no  other voiding symptoms.  All others are negative.   PHYSICAL EXAMINATION:  GENERAL:  This is a well-built 70 year old male in no  acute distress.  He is oriented to time, place and person.  VITAL SIGNS:  Blood pressure is 180/90, pulse 71, respirations 18,  temperature 97.1.  Weight 274 pounds.  HEENT:  His head is normal.   Pupils are equal and reactive to light and  accommodation.  He has pink conjunctivae.  Ears, nose and throat within  normal limits.  NECK:  Supple.  No cervical lymph nodes.  No thyromegaly.  CHEST:  Symmetrical.  LUNGS:  Fully expanded and clear to percussion and auscultation.  HEART:  Regular rhythm.  No murmur, no gallops.  ABDOMEN:  Protuberant.  Nontender.  He has no CVA tenderness.  Kidneys are  not palpable.  He has no hepatomegaly. No splenomegaly.  He has no inguinal  hernia.  His bowel sounds are normal.  GENITALIA:  Penis is uncircumcised and meatus is normal.  Testicles are  normal.  Cords and epididymis are within normal limits.  EXTREMITIES:  Within normal limits.  RECTAL:  Sphincter tone is normal.  Prostate is enlarged, 40 grams.  No  nodules.  Seminal vesicles not palpable.  ADMISSION DIAGNOSES:  1.  Adenocarcinoma of the prostate.  2.  Hypertension.      Lindaann Slough, M.D.  Electronically Signed     MN/MEDQ  D:  06/10/2005  T:  06/10/2005  Job:  [id number]

## 2011-01-10 LAB — POCT I-STAT 4, (NA,K, GLUC, HGB,HCT)
Glucose, Bld: 105 — ABNORMAL HIGH
HCT: 45

## 2011-02-01 LAB — APTT: aPTT: 30

## 2011-02-01 LAB — URINE CULTURE: Special Requests: NEGATIVE

## 2011-02-01 LAB — PROTIME-INR: INR: 1

## 2011-02-01 LAB — URINE MICROSCOPIC-ADD ON

## 2011-02-01 LAB — CBC
Hemoglobin: 13.6
RBC: 4.47
WBC: 9.2

## 2011-02-01 LAB — URINALYSIS, ROUTINE W REFLEX MICROSCOPIC
Bilirubin Urine: NEGATIVE
Glucose, UA: NEGATIVE
Ketones, ur: NEGATIVE
pH: 6

## 2012-11-16 ENCOUNTER — Telehealth: Payer: Self-pay | Admitting: Internal Medicine

## 2012-11-16 ENCOUNTER — Encounter: Payer: Self-pay | Admitting: Internal Medicine

## 2012-11-16 NOTE — Telephone Encounter (Signed)
Tiffany from Dr Letitia Neri office called to set up ASAP OV per Dr Felecia Shelling for rectal bleeding. Pt is having blood work done today and will be sending those to [place] when available. AS agreed to see patient tomorrow at 230

## 2012-11-16 NOTE — Telephone Encounter (Signed)
agree

## 2012-11-17 ENCOUNTER — Encounter: Payer: Self-pay | Admitting: Gastroenterology

## 2012-11-17 ENCOUNTER — Ambulatory Visit (INDEPENDENT_AMBULATORY_CARE_PROVIDER_SITE_OTHER): Payer: Medicare Other | Admitting: Gastroenterology

## 2012-11-17 ENCOUNTER — Encounter (HOSPITAL_COMMUNITY): Payer: Self-pay | Admitting: Pharmacy Technician

## 2012-11-17 VITALS — BP 141/73 | HR 63 | Temp 98.4°F | Ht 68.0 in | Wt 272.0 lb

## 2012-11-17 DIAGNOSIS — K625 Hemorrhage of anus and rectum: Secondary | ICD-10-CM

## 2012-11-17 MED ORDER — PEG 3350-KCL-NA BICARB-NACL 420 G PO SOLR: 4000.0000 mL | ORAL | Status: DC

## 2012-11-17 NOTE — Patient Instructions (Addendum)
We have set you up for a colonoscopy with Dr. Jena Gauss in the near future.  If you notice worsening rectal bleeding, severe abdominal pain, present to the emergency room.

## 2012-11-17 NOTE — Progress Notes (Signed)
Primary Care Physician:  Avon Gully, MD Primary Gastroenterologist:  Dr. Jena Gauss   Chief Complaint  Patient presents with  . Rectal Bleeding    HPI:   Mr. [name] is a 72 year old male who presents today as an urgent visit secondary to rectal bleeding.He has a history significant for a large tubulovillous adenoma in 2001, with most recent colonoscopy in 2009 by Dr. Jena Gauss noting a normal rectum and pedunculated polyp of the distal transverse colon. Path revealed tubular adenoma.   Notes rectal bleeding with bowel movements. Onset Thursday. Notes very slight discomfort LLQ with walking. Notes LLQ discomfort started slightly before rectal bleeding. No fever or chills.  Notes at onset, large amount of blood. No changes in bowel habits. Sees Dr. Janee Morn for chiropractic treatment, with improvement in bowel habits. No rectal discomfort. Notes improvement in rectal bleeding since Thursday. No pain with eating. No vomiting. Very mild nausea yesterday. Denies fatigue, weakness. Stopped Plavix on 7/28.     Past Medical History  Diagnosis Date  . Bladder cancer   . Stroke 3/09  . Prostate cancer   . Hypertension     Past Surgical History  Procedure Laterality Date  . Colonoscopy  12/23/2007    RMR: Normal rectum.  Pedunculated polyp, distal transverse colon, status post hot snare removal.  Remainder of colonic mucosa and terminal mucosa appeared normal. path with multiple fragments of tubular adenoma  . Colonoscopy  12/08/2002    AVW:UJWJX internal hemorrhoids, otherwise, normal colonoscopy  . Colonoscopy  2001    per Dr. Patty Sermons note in 2004, large tubulovillous adenoma  . Back surgery  1996  . Prostate surgery  2000    Current Outpatient Prescriptions  Medication Sig Dispense Refill  . amLODipine (NORVASC) 10 MG tablet Take 10 mg by mouth daily.       Marland Kitchen aspirin 325 MG tablet Take 325 mg by mouth daily.      . cloNIDine (CATAPRES) 0.2 MG tablet Take 0.2 mg by mouth 2 (two) times  daily.       . furosemide (LASIX) 20 MG tablet Take 20 mg by mouth daily.       . hydrALAZINE (APRESOLINE) 50 MG tablet Take 50 mg by mouth 3 (three) times daily.       Marland Kitchen lisinopril (PRINIVIL,ZESTRIL) 40 MG tablet Take 40 mg by mouth daily.       Marland Kitchen omeprazole (PRILOSEC) 20 MG capsule Take 20 mg by mouth daily.        No current facility-administered medications for this visit.    Allergies as of 11/17/2012  . (No Known Allergies)    Family History  Problem Relation Age of Onset  . Colon cancer Neg Hx     History   Social History  . Marital Status: Married    Spouse Name: N/A    Number of Children: N/A  . Years of Education: N/A   Occupational History  . Not on file.   Social History Main Topics  . Smoking status: Never Smoker   . Smokeless tobacco: Not on file     Comment: quit in 2009  . Alcohol Use: No  . Drug Use: No  . Sexually Active: Not on file   Other Topics Concern  . Not on file   Social History Narrative  . No narrative on file    Review of Systems: Gen: Denies any fever, chills, fatigue, weight loss, lack of appetite.  CV: Denies chest pain, heart palpitations, peripheral edema, syncope.  Resp:  DOE GI: see HPI GU : Denies urinary burning, urinary frequency, urinary hesitancy MS: left sided weakness since stroke in 2009  Derm: Denies rash, itching, dry skin Psych: Denies depression, anxiety, memory loss, and confusion   Physical Exam: BP 141/73  Pulse 63  Temp(Src) 98.4 F (36.9 C) (Oral)  Ht 5\' 8"  (1.727 m)  Wt 272 lb (123.378 kg)  BMI 41.37 kg/m2 General:   Alert and oriented. Pleasant and cooperative. Well-nourished and well-developed.  Head:  Normocephalic and atraumatic. Eyes:  Without icterus, sclera clear and conjunctiva pink.  Ears:  Normal auditory acuity. Nose:  No deformity, discharge,  or lesions. Mouth:  No deformity or lesions, oral mucosa pink.  Neck:  Supple, without mass or thyromegaly. Lungs:  Clear to auscultation  bilaterally. No wheezes, rales, or rhonchi. No distress.  Heart:  S1, S2 present without murmurs appreciated.  Abdomen:  +BS, soft, non-tender and non-distended. No HSM noted. No guarding or rebound. No masses appreciated.  Rectal:  Small external hemorrhoid tag, internal exam without mass, stool with scant amount of maroon-colored stool in rectal vault.  Msk:  Symmetrical without gross deformities. Normal posture. Extremities:  2+edema bilateral lower extremities Neurologic:  Alert and  oriented x4;  grossly normal neurologically. Skin:  Intact without significant lesions or rashes. Cervical Nodes:  No significant cervical adenopathy. Psych:  Alert and cooperative. Normal mood and affect.

## 2012-11-17 NOTE — Assessment & Plan Note (Signed)
72 year old male presenting as an urgent evaluation due to acute onset of rectal bleeding several days ago; his symptoms seem to correlate with possible ischemic colitis. Less likely diverticular bleed. Possible benign anorectal source. His last colonoscopy was in 2009, with evidence of tubular adenoma; he also has a history significant for a large tubulovillous adenoma in the remote past. He is actually due for surveillance now.   We will proceed with a colonoscopy with Dr. Jena Gauss in the next few days, and I have requested blood work from [employer] office. This was not available at time of visit; the patient does not appear to be in any distress. The risks, benefits, and alternatives were discussed in detail at the time of the appointment.

## 2012-11-20 ENCOUNTER — Ambulatory Visit (HOSPITAL_COMMUNITY)
Admission: RE | Admit: 2012-11-20 | Discharge: 2012-11-20 | Disposition: A | Discharge status: Home / Self Care | Payer: Medicare Other | Source: Ambulatory Visit | Attending: Internal Medicine | Admitting: Internal Medicine

## 2012-11-20 ENCOUNTER — Encounter (HOSPITAL_COMMUNITY)
Admission: RE | Disposition: A | Discharge status: Home / Self Care | Payer: Self-pay | Source: Ambulatory Visit | Attending: Internal Medicine

## 2012-11-20 ENCOUNTER — Encounter (HOSPITAL_COMMUNITY): Payer: Self-pay

## 2012-11-20 DIAGNOSIS — D126 Benign neoplasm of colon, unspecified: Secondary | ICD-10-CM | POA: Insufficient documentation

## 2012-11-20 DIAGNOSIS — K5730 Diverticulosis of large intestine without perforation or abscess without bleeding: Secondary | ICD-10-CM | POA: Insufficient documentation

## 2012-11-20 DIAGNOSIS — Z860100 Personal history of colon polyps, unspecified: Secondary | ICD-10-CM | POA: Insufficient documentation

## 2012-11-20 DIAGNOSIS — Z8546 Personal history of malignant neoplasm of prostate: Secondary | ICD-10-CM | POA: Insufficient documentation

## 2012-11-20 DIAGNOSIS — I69959 Hemiplegia and hemiparesis following unspecified cerebrovascular disease affecting unspecified side: Secondary | ICD-10-CM | POA: Insufficient documentation

## 2012-11-20 DIAGNOSIS — Z79899 Other long term (current) drug therapy: Secondary | ICD-10-CM | POA: Insufficient documentation

## 2012-11-20 DIAGNOSIS — Z8551 Personal history of malignant neoplasm of bladder: Secondary | ICD-10-CM | POA: Insufficient documentation

## 2012-11-20 DIAGNOSIS — Z7902 Long term (current) use of antithrombotics/antiplatelets: Secondary | ICD-10-CM | POA: Insufficient documentation

## 2012-11-20 DIAGNOSIS — K648 Other hemorrhoids: Secondary | ICD-10-CM | POA: Insufficient documentation

## 2012-11-20 DIAGNOSIS — K921 Melena: Secondary | ICD-10-CM

## 2012-11-20 DIAGNOSIS — I10 Essential (primary) hypertension: Secondary | ICD-10-CM | POA: Insufficient documentation

## 2012-11-20 DIAGNOSIS — K625 Hemorrhage of anus and rectum: Secondary | ICD-10-CM | POA: Insufficient documentation

## 2012-11-20 DIAGNOSIS — Z7982 Long term (current) use of aspirin: Secondary | ICD-10-CM | POA: Insufficient documentation

## 2012-11-20 HISTORY — PX: COLONOSCOPY: SHX5424

## 2012-11-20 LAB — HEMOGLOBIN AND HEMATOCRIT, BLOOD: Hemoglobin: 12.6 g/dL — ABNORMAL LOW (ref 13.0–17.0)

## 2012-11-20 SURGERY — COLONOSCOPY
Anesthesia: Moderate Sedation

## 2012-11-20 MED ORDER — MEPERIDINE HCL 100 MG/ML IJ SOLN
INTRAMUSCULAR | Status: DC | PRN
  Administered 2012-11-20: 50 mg via INTRAVENOUS

## 2012-11-20 MED ORDER — MEPERIDINE HCL 100 MG/ML IJ SOLN
INTRAMUSCULAR | Status: AC
  Filled 2012-11-20: qty 2

## 2012-11-20 MED ORDER — SODIUM CHLORIDE 0.9 % IV SOLN
INTRAVENOUS | Status: DC
  Administered 2012-11-20: 11:00:00 via INTRAVENOUS

## 2012-11-20 MED ORDER — MIDAZOLAM HCL 5 MG/5ML IJ SOLN
INTRAMUSCULAR | Status: DC | PRN
  Administered 2012-11-20: 1 mg via INTRAVENOUS
  Administered 2012-11-20: 2 mg via INTRAVENOUS
  Administered 2012-11-20 (×2): 1 mg via INTRAVENOUS

## 2012-11-20 MED ORDER — STERILE WATER FOR IRRIGATION IR SOLN
Status: DC | PRN
  Administered 2012-11-20: 12:00:00

## 2012-11-20 MED ORDER — MIDAZOLAM HCL 5 MG/5ML IJ SOLN
INTRAMUSCULAR | Status: AC
  Filled 2012-11-20: qty 10

## 2012-11-20 MED ORDER — ONDANSETRON HCL 4 MG/2ML IJ SOLN
INTRAMUSCULAR | Status: AC
  Filled 2012-11-20: qty 2

## 2012-11-20 MED ORDER — ONDANSETRON HCL 4 MG/2ML IJ SOLN
INTRAMUSCULAR | Status: DC | PRN
  Administered 2012-11-20: 4 mg via INTRAVENOUS

## 2012-11-20 NOTE — H&P (View-Only) (Signed)
 Primary Care Physician:  FANTA,TESFAYE, MD Primary Gastroenterologist:  Dr. Rourk   Chief Complaint  Patient presents with  . Rectal Bleeding    HPI:   [name] is a 72-year-old male who presents today as an urgent visit secondary to rectal bleeding.He has a history significant for a large tubulovillous adenoma in 2001, with most recent colonoscopy in 2009 by Dr. Rourk noting a normal rectum and pedunculated polyp of the distal transverse colon. Path revealed tubular adenoma.   Notes rectal bleeding with bowel movements. Onset Thursday. Notes very slight discomfort LLQ with walking. Notes LLQ discomfort started slightly before rectal bleeding. No fever or chills.  Notes at onset, large amount of blood. No changes in bowel habits. Sees Dr. Thompson for chiropractic treatment, with improvement in bowel habits. No rectal discomfort. Notes improvement in rectal bleeding since Thursday. No pain with eating. No vomiting. Very mild nausea yesterday. Denies fatigue, weakness. Stopped Plavix on 7/28.     Past Medical History  Diagnosis Date  . Bladder cancer   . Stroke 3/09  . Prostate cancer   . Hypertension     Past Surgical History  Procedure Laterality Date  . Colonoscopy  12/23/2007    RMR: Normal rectum.  Pedunculated polyp, distal transverse colon, status post hot snare removal.  Remainder of colonic mucosa and terminal mucosa appeared normal. path with multiple fragments of tubular adenoma  . Colonoscopy  12/08/2002    NUR:Small internal hemorrhoids, otherwise, normal colonoscopy  . Colonoscopy  2001    per Dr. Rehman's note in 2004, large tubulovillous adenoma  . Back surgery  1996  . Prostate surgery  2000    Current Outpatient Prescriptions  Medication Sig Dispense Refill  . amLODipine (NORVASC) 10 MG tablet Take 10 mg by mouth daily.       . aspirin 325 MG tablet Take 325 mg by mouth daily.      . cloNIDine (CATAPRES) 0.2 MG tablet Take 0.2 mg by mouth 2 (two) times  daily.       . furosemide (LASIX) 20 MG tablet Take 20 mg by mouth daily.       . hydrALAZINE (APRESOLINE) 50 MG tablet Take 50 mg by mouth 3 (three) times daily.       . lisinopril (PRINIVIL,ZESTRIL) 40 MG tablet Take 40 mg by mouth daily.       . omeprazole (PRILOSEC) 20 MG capsule Take 20 mg by mouth daily.        No current facility-administered medications for this visit.    Allergies as of 11/17/2012  . (No Known Allergies)    Family History  Problem Relation Age of Onset  . Colon cancer Neg Hx     History   Social History  . Marital Status: Married    Spouse Name: N/A    Number of Children: N/A  . Years of Education: N/A   Occupational History  . Not on file.   Social History Main Topics  . Smoking status: Never Smoker   . Smokeless tobacco: Not on file     Comment: quit in 2009  . Alcohol Use: No  . Drug Use: No  . Sexually Active: Not on file   Other Topics Concern  . Not on file   Social History Narrative  . No narrative on file    Review of Systems: Gen: Denies any fever, chills, fatigue, weight loss, lack of appetite.  CV: Denies chest pain, heart palpitations, peripheral edema, syncope.  Resp:   DOE GI: see HPI GU : Denies urinary burning, urinary frequency, urinary hesitancy MS: left sided weakness since stroke in 2009  Derm: Denies rash, itching, dry skin Psych: Denies depression, anxiety, memory loss, and confusion   Physical Exam: BP 141/73  Pulse 63  Temp(Src) 98.4 F (36.9 C) (Oral)  Ht 5' 8" (1.727 m)  Wt 272 lb (123.378 kg)  BMI 41.37 kg/m2 General:   Alert and oriented. Pleasant and cooperative. Well-nourished and well-developed.  Head:  Normocephalic and atraumatic. Eyes:  Without icterus, sclera clear and conjunctiva pink.  Ears:  Normal auditory acuity. Nose:  No deformity, discharge,  or lesions. Mouth:  No deformity or lesions, oral mucosa pink.  Neck:  Supple, without mass or thyromegaly. Lungs:  Clear to auscultation  bilaterally. No wheezes, rales, or rhonchi. No distress.  Heart:  S1, S2 present without murmurs appreciated.  Abdomen:  +BS, soft, non-tender and non-distended. No HSM noted. No guarding or rebound. No masses appreciated.  Rectal:  Small external hemorrhoid tag, internal exam without mass, stool with scant amount of maroon-colored stool in rectal vault.  Msk:  Symmetrical without gross deformities. Normal posture. Extremities:  2+edema bilateral lower extremities Neurologic:  Alert and  oriented x4;  grossly normal neurologically. Skin:  Intact without significant lesions or rashes. Cervical Nodes:  No significant cervical adenopathy. Psych:  Alert and cooperative. Normal mood and affect.    

## 2012-11-20 NOTE — Op Note (Signed)
[facility] [address] [place], [address]   COLONOSCOPY PROCEDURE REPORT  PATIENT: Furman, Trentman  MR#:         [id number] BIRTHDATE: 1941/03/03 , 72  yrs. old GENDER: Male ENDOSCOPIST: R.  Roetta Sessions, MD FACP FACG REFERRED BY:  Glenice Laine, M.D. PROCEDURE DATE:  11/20/2012 PROCEDURE:     Ileocolonoscopy snare polypectomy and biopsy  INDICATIONS: Recent paper hematochezia  INFORMED CONSENT:  The risks, benefits, alternatives and imponderables including but not limited to bleeding, perforation as well as the possibility of a missed lesion have been reviewed.  The potential for biopsy, lesion removal, etc. have also been discussed.  Questions have been answered.  All parties agreeable. Please see the history and physical in the medical record for more information.  MEDICATIONS: Versed 5 mg IV and Demerol 50 mg in divided doses. Zofran 4 mg IV  DESCRIPTION OF PROCEDURE:  After a digital rectal exam was performed, the EC-3890Li ([id number])  colonoscope was advanced from the anus through the rectum and colon to the area of the cecum, ileocecal valve and appendiceal orifice.  The cecum was deeply intubated.  These structures were well-seen and photographed for the record.  From the level of the cecum and ileocecal valve, the scope was slowly and cautiously withdrawn.  The mucosal surfaces were carefully surveyed utilizing scope tip deflection to facilitate fold flattening as needed.  The scope was pulled down into the rectum where a thorough examination including retroflexion was performed.    FINDINGS:  adequate preparation.  minimal internal hemorrhoids otherwise, normal rectum. Scattered left-sided diverticula.; (1) diminutive polyp and one 5 mm pedunculated polyp in the splenic flexure; otherwise, per min or clunk as per normal. The distal 5 cm of terminal ileal mucosa appeared normal.   THERAPEUTIC / DIAGNOSTIC MANEUVERS PERFORMED:  The  above-mentioned polyps were cold biopsied and cold snared/removed  COMPLICATIONS: None  CECAL WITHDRAWAL TIME:  17 minutes  IMPRESSION:  Colonic diverticulosis. Colonic polyps  -  removed as described above. I suspect benign anorectal bleeding. I doubt this is rapid transit upper GI situation.  RECOMMENDATIONS: Followup on pathology. Begin Benefiber 2 teaspoons daily.  H&H day (last hemoglobin in the record 13.1 back in 2010).  Continue Prilosec especially while taking an aspirin daily.  If bleeding recurs patient is to let me know.   _______________________________ eSigned:  R. Roetta Sessions, MD FACP [facility] 11/20/2012 12:49 PM   CC:    PATIENT NAME:  Amish, Mintzer MR#: [id number]

## 2012-11-20 NOTE — Interval H&P Note (Signed)
History and Physical Interval Note:  11/20/2012 12:02 PM  [name]  has presented today for surgery, with the diagnosis of RECTAL BLEEDING  The various methods of treatment have been discussed with the patient and family. After consideration of risks, benefits and other options for treatment, the patient has consented to  Procedure(s) with comments: COLONOSCOPY (N/A) - 12:15 as a surgical intervention .  The patient's history has been reviewed, patient examined, no change in status, stable for surgery.  I have reviewed the patient's chart and labs.  Questions were answered to the patient's satisfaction.     Change. Colonoscopy per plan.The risks, benefits, limitations, alternatives and imponderables have been reviewed with the patient. Questions have been answered. All parties are agreeable.   Eula Listen

## 2012-11-20 NOTE — Progress Notes (Signed)
Notified after procedure that patient on plavix 75mg .  Dr. Jena Gauss notified that patient took medication one day prior to procedure per Dr. Felecia Shelling order. Dr. Jena Gauss ok with finding and instructed patient that if he sees more than a trickle of blood in stool, to contact Dr. Jena Gauss or go to ER stat.

## 2012-11-23 ENCOUNTER — Encounter (HOSPITAL_COMMUNITY): Payer: Self-pay | Admitting: Internal Medicine

## 2012-11-25 ENCOUNTER — Encounter: Payer: Self-pay | Admitting: Internal Medicine

## 2012-11-25 NOTE — Progress Notes (Signed)
Cc PCP 

## 2012-11-27 ENCOUNTER — Telehealth: Payer: Self-pay

## 2012-11-27 DIAGNOSIS — K625 Hemorrhage of anus and rectum: Secondary | ICD-10-CM

## 2012-11-27 NOTE — Telephone Encounter (Signed)
Letter mailed to pt Lab order on file 

## 2012-11-27 NOTE — Telephone Encounter (Signed)
Reminder in epic °

## 2012-11-27 NOTE — Telephone Encounter (Signed)
Letter from: Corbin Ade   Reason for Letter: Results Review   Send letter to patient.  Send copy of letter with path to referring provider and PCP.   Need a cbc in 3 mos; offer ov w extender for a recheck

## 2012-11-30 ENCOUNTER — Encounter: Payer: Self-pay | Admitting: Internal Medicine

## 2013-02-16 ENCOUNTER — Other Ambulatory Visit: Payer: Self-pay

## 2013-02-16 DIAGNOSIS — K625 Hemorrhage of anus and rectum: Secondary | ICD-10-CM

## 2013-03-03 ENCOUNTER — Encounter: Payer: Self-pay | Admitting: Internal Medicine

## 2013-03-03 ENCOUNTER — Telehealth: Payer: Self-pay | Admitting: Internal Medicine

## 2013-03-24 ENCOUNTER — Ambulatory Visit: Payer: Medicare Other | Admitting: Gastroenterology

## 2013-04-05 ENCOUNTER — Ambulatory Visit (INDEPENDENT_AMBULATORY_CARE_PROVIDER_SITE_OTHER): Payer: Medicare Other | Admitting: Gastroenterology

## 2013-04-05 ENCOUNTER — Encounter (INDEPENDENT_AMBULATORY_CARE_PROVIDER_SITE_OTHER): Payer: Self-pay

## 2013-04-05 VITALS — BP 149/79 | HR 61 | Temp 96.2°F | Ht 67.0 in | Wt 266.6 lb

## 2013-04-05 DIAGNOSIS — K625 Hemorrhage of anus and rectum: Secondary | ICD-10-CM

## 2013-04-05 LAB — CBC WITH DIFFERENTIAL/PLATELET
Basophils Relative: 1 % (ref 0–1)
Eosinophils Absolute: 0.1 10*3/uL (ref 0.0–0.7)
Eosinophils Relative: 2 % (ref 0–5)
MCH: 30.8 pg (ref 26.0–34.0)
MCHC: 34.5 g/dL (ref 30.0–36.0)
Neutrophils Relative %: 61 % (ref 43–77)
Platelets: 214 10*3/uL (ref 150–400)
RBC: 4.45 MIL/uL (ref 4.22–5.81)
RDW: 15.2 % (ref 11.5–15.5)

## 2013-04-05 NOTE — Assessment & Plan Note (Signed)
Resolved. Given recurrent tubular adenomas, his next colonoscopy will be due in 2019. He will have a CBC done today. Office visit when necessary.

## 2013-04-05 NOTE — Progress Notes (Signed)
      Primary Care Physician: Avon Gully, MD  Primary Gastroenterologist:  Roetta Sessions, MD   Chief Complaint  Patient presents with  . Follow-up    pt said he is doing good    HPI: [name] is a 72 y.o. male here for followup. He was seen in July 2014 for an urgent office visit related to rectal bleeding. Episode of large-volume hematochezia. Was on Plavix. Some left lower quadrant pain at the time. Colonoscopy revealed colonic diverticulosis. Minimal internal hemorrhoids. 2 polyps which are tubular adenomas. Normal terminal ileum. Suspected benign anorectal bleeding. Hemoglobin the day of his procedure was 12.6.  He's been feeling well. He has not had any further bleeding. Bowel movements are regular. Denies abdominal pain. He has not had his blood work done which was advised by Dr. Jena Gauss.  Current Outpatient Prescriptions  Medication Sig Dispense Refill  . amLODipine (NORVASC) 10 MG tablet Take 10 mg by mouth daily.       Marland Kitchen aspirin 325 MG tablet Take 325 mg by mouth daily.      . cloNIDine (CATAPRES) 0.2 MG tablet Take 0.2 mg by mouth 2 (two) times daily.       . clopidogrel (PLAVIX) 75 MG tablet Take 75 mg by mouth daily.      . furosemide (LASIX) 20 MG tablet Take 20 mg by mouth 3 (three) times daily.       . hydrALAZINE (APRESOLINE) 50 MG tablet Take 50 mg by mouth 3 (three) times daily.       Marland Kitchen lisinopril (PRINIVIL,ZESTRIL) 40 MG tablet Take 40 mg by mouth daily.       Marland Kitchen omeprazole (PRILOSEC) 20 MG capsule Take 20 mg by mouth daily.        No current facility-administered medications for this visit.    Allergies as of 04/05/2013  . (No Known Allergies)    ROS:  General: Negative for anorexia, weight loss, fever, chills, fatigue, weakness. ENT: Negative for hoarseness, difficulty swallowing , nasal congestion. CV: Negative for chest pain, angina, palpitations, dyspnea on exertion, peripheral edema.  Respiratory: Negative for dyspnea at rest, dyspnea on  exertion, cough, sputum, wheezing.  GI: See history of present illness. GU:  Negative for dysuria, hematuria, urinary incontinence, urinary frequency, nocturnal urination.  Endo: Negative for unusual weight change.    Physical Examination:   BP 149/79  Pulse 61  Temp(Src) 96.2 F (35.7 C) (Oral)  Ht 5\' 7"  (1.702 m)  Wt 266 lb 9.6 oz (120.929 kg)  BMI 41.75 kg/m2  General: Well-nourished, well-developed in no acute distress.  Eyes: No icterus. Mouth: Oropharyngeal mucosa moist and pink , no lesions erythema or exudate. Lungs: Clear to auscultation bilaterally.  Heart: Regular rate and rhythm, no murmurs rubs or gallops.  Abdomen: Bowel sounds are normal, nontender, nondistended, no hepatosplenomegaly or masses, no abdominal bruits or hernia , no rebound or guarding.   Extremities: No lower extremity edema. No clubbing or deformities. Neuro: Alert and oriented x 4   Skin: Warm and dry, no jaundice.   Psych: Alert and cooperative, normal mood and affect.

## 2013-04-05 NOTE — Progress Notes (Signed)
cc'd to pcp 

## 2013-04-05 NOTE — Patient Instructions (Signed)
1.  Please have your bloodwork done

## 2013-04-12 NOTE — Progress Notes (Signed)
CBC completely normal. No further evaluation unless patient develops symptoms

## 2013-05-07 ENCOUNTER — Encounter: Payer: Self-pay | Admitting: Internal Medicine

## 2013-05-07 ENCOUNTER — Ambulatory Visit (HOSPITAL_COMMUNITY)
Admission: RE | Admit: 2013-05-07 | Discharge: 2013-05-07 | Disposition: A | Discharge status: Home / Self Care | Payer: Medicare Other | Source: Ambulatory Visit | Attending: Internal Medicine | Admitting: Internal Medicine

## 2013-05-07 ENCOUNTER — Other Ambulatory Visit (HOSPITAL_COMMUNITY): Payer: Self-pay | Admitting: Internal Medicine

## 2013-05-07 ENCOUNTER — Telehealth: Payer: Self-pay | Admitting: Internal Medicine

## 2013-05-07 DIAGNOSIS — M545 Low back pain, unspecified: Secondary | ICD-10-CM | POA: Insufficient documentation

## 2013-05-07 DIAGNOSIS — M47817 Spondylosis without myelopathy or radiculopathy, lumbosacral region: Secondary | ICD-10-CM | POA: Insufficient documentation

## 2013-05-07 DIAGNOSIS — M4120 Other idiopathic scoliosis, site unspecified: Secondary | ICD-10-CM | POA: Insufficient documentation

## 2013-05-07 DIAGNOSIS — M549 Dorsalgia, unspecified: Secondary | ICD-10-CM

## 2013-05-07 NOTE — Telephone Encounter (Signed)
Per RMR patient needs to come in next week and may well benefit from hemorrhoid banding if it would be permissible for him to come off aspirin and plavix for 5 days or so.  I have an opening with LSL on 1/26 at 0930. Should I put patient on her schedule or does this patient need a banding with RMR next week? Please advise.

## 2013-05-07 NOTE — Progress Notes (Signed)
Patient ID: [name], male   DOB: 11/01/1940, 73 y.o.   MRN: [id number] Dr. Legrand Rams called about patient. Recent bright red blood per rectum. Does this intermittently for some time. Seen in the office last month. Hemoglobin normal. Colonoscopy last year demonstrated internal hemorrhoids likely source of hematochezia. On Plavix and aspirin. We need to probably see him back in the office next week. He may well benefit from hemorrhoid banding if it would be permissible for him to come off aspirin and Plavix for 5 days or so.

## 2013-05-10 NOTE — Telephone Encounter (Signed)
Pt's wife is aware of OV with AS on 1/22 at 9

## 2013-05-10 NOTE — Progress Notes (Signed)
Pt's wife is aware of OV

## 2013-05-10 NOTE — Telephone Encounter (Signed)
Pt needs an ov please.

## 2013-05-13 ENCOUNTER — Encounter: Payer: Self-pay | Admitting: Gastroenterology

## 2013-05-13 ENCOUNTER — Ambulatory Visit: Payer: Medicare Other | Admitting: Internal Medicine

## 2013-05-13 ENCOUNTER — Ambulatory Visit (INDEPENDENT_AMBULATORY_CARE_PROVIDER_SITE_OTHER): Payer: Medicare Other | Admitting: Gastroenterology

## 2013-05-13 ENCOUNTER — Encounter (INDEPENDENT_AMBULATORY_CARE_PROVIDER_SITE_OTHER): Payer: Self-pay

## 2013-05-13 VITALS — BP 136/74 | HR 60 | Temp 98.4°F | Wt 270.2 lb

## 2013-05-13 DIAGNOSIS — K648 Other hemorrhoids: Secondary | ICD-10-CM

## 2013-05-13 DIAGNOSIS — K625 Hemorrhage of anus and rectum: Secondary | ICD-10-CM

## 2013-05-13 HISTORY — PX: HEMORRHOID BANDING: SHX5850

## 2013-05-13 NOTE — Progress Notes (Signed)
Discussed with Dr. Gala Romney. Possibility of hemorrhoid banding today IF Dr. Legrand Rams is agreeable to patient holding Plavix an additional 10 days after this procedure. Contact Dr. Josephine Cables office.

## 2013-05-13 NOTE — Patient Instructions (Signed)
Avoid straining.  Benefiber 2 teaspoons twice daily  Limit toilet time to 2-3 minutes  Call with any interim problems  Schedule followup appointment in one month  Continue taking aspirin daily  Resume Plavix on February 2nd

## 2013-05-13 NOTE — Progress Notes (Signed)
[place] banding procedure note:  Patient with recurrent painless paper hematochezia. Cause patient significant distress although H&H has remained good. Known internal hemorrhoids and colonic diverticulosis on colonoscopy last year. On Plavix and aspirin-but recently stopped by Dr. Legrand Rams. He presents for definitive treatment of hemorrhoidal disease. Dr. Legrand Rams tells [place] it is safe for him to remain off Plavix for another 10 days.  It Is notable that bleeding has tapered off and, in fact, ceased since aspirin /Plavix has been held according to patient. Sometimes sits for several minutes on the toilet. Denies straining. One bowel movement daily.    The patient presents with symptomatic grade 1 hemorrhoids. Discussed rubber band ligation today in a window off significant antiplatelet therapy. Risks, benefits, limitations reviewed. The potential for eventual recurrent bleeding and the need for additional banding reviewed.  Risk of bleeding following banding may be increased with antiplatelet therapy.  Digital rectal exam performed revealing no abnormalities. Anoscopy performed revealing 3 prominent internal hemorrhoid piles. The decision was made to band 2 of the 3 hemorrhoidal columns.  Utilizing the [facility] system, the right anterior hemorrhoid was banded followed by the right posterior hemorrhoid column. A followup digital rectal exam revealed the bands to be in excellent position with no pinching or pain. No complications were encountered and the patient tolerated the procedure well.  Recommendations:  Avoid straining.  Benefiber 2 teaspoons twice daily  Limit toilet time to 2-3 minutes  Call with any interim problems  Schedule followup appointment in one month  Continue taking aspirin daily  Resume Plavix on February 2nd

## 2013-05-13 NOTE — Progress Notes (Signed)
Patient aware of appt. He will be here at 4:15.

## 2013-05-13 NOTE — Patient Instructions (Signed)
I am discussing with Dr. Gala Romney the best time do the hemorrhoid banding. Do not take aspirin or Plavix until we get back in touch with you.   We will be in touch today!

## 2013-05-13 NOTE — Progress Notes (Signed)
Referring Provider: Rosita Fire, MD Primary Care Physician:  Rosita Fire, MD Primary GI: Dr. Gala Romney   Chief Complaint  Patient presents with  . Follow-up    HPI:   [name] presents today in follow-up secondary to rectal bleeding. Recent colonoscopy revealed colonic diverticulosis, minimal internal hemorrhoids, tubular adenomas. On aspirin and Plavix. Has been off since May 06, 2013.  Rectal bleeding has slowed, cleared up with suppositories and holding ASA and Plavix. Denies straining, constipation. No rectal discomfort. No abdominal pain. No other complaints.    Past Medical History  Diagnosis Date  . Bladder cancer   . Stroke 3/09  . Prostate cancer   . Hypertension     Past Surgical History  Procedure Laterality Date  . Colonoscopy  12/23/2007    RMR: Normal rectum.  Pedunculated polyp, distal transverse colon, status post hot snare removal.  Remainder of colonic mucosa and terminal mucosa appeared normal. path with multiple fragments of tubular adenoma  . Colonoscopy  12/08/2002    [id number] internal hemorrhoids, otherwise, normal colonoscopy  . Colonoscopy  2001    per Dr. Olevia Perches note in 2004, large tubulovillous adenoma  . Back surgery  1996  . Prostate surgery  2000  . Colonoscopy N/A 11/20/2012    [id number] diverticulosis. Colonic polyps-removed as described above. I suspect benign anorectal bleeding.  tubular adenomas. next TCS 11/2017.    Current Outpatient Prescriptions  Medication Sig Dispense Refill  . amLODipine (NORVASC) 10 MG tablet Take 10 mg by mouth daily.       . cloNIDine (CATAPRES) 0.2 MG tablet Take 0.2 mg by mouth 2 (two) times daily.       . furosemide (LASIX) 20 MG tablet Take 20 mg by mouth 3 (three) times daily.       . hydrALAZINE (APRESOLINE) 50 MG tablet Take 50 mg by mouth 3 (three) times daily.       Marland Kitchen lisinopril (PRINIVIL,ZESTRIL) 40 MG tablet Take 40 mg by mouth daily.       Marland Kitchen omeprazole (PRILOSEC) 20 MG capsule Take 20 mg  by mouth daily.       . traMADol (ULTRAM) 50 MG tablet Take 50 mg by mouth 2 (two) times daily.       Marland Kitchen aspirin 325 MG tablet Take 325 mg by mouth daily.      . clopidogrel (PLAVIX) 75 MG tablet Take 75 mg by mouth daily.       No current facility-administered medications for this visit.    Allergies as of 05/13/2013  . (No Known Allergies)    Family History  Problem Relation Age of Onset  . Colon cancer Neg Hx     History   Social History  . Marital Status: Married    Spouse Name: N/A    Number of Children: N/A  . Years of Education: N/A   Social History Main Topics  . Smoking status: Former Research scientist (life sciences)  . Smokeless tobacco: None     Comment: quit in 2009  . Alcohol Use: No  . Drug Use: No  . Sexual Activity: None   Other Topics Concern  . None   Social History Narrative  . None    Review of Systems: As mentioned in HPI.   Physical Exam: BP 136/74  Pulse 60  Temp(Src) 98.4 F (36.9 C) (Oral)  Wt 270 lb 3.2 oz (122.562 kg) General:   Alert and oriented. No distress noted. Pleasant and cooperative.  Head:  Normocephalic and atraumatic. Eyes:  Conjuctiva clear without scleral icterus. Mouth:  Oral mucosa pink and moist. Good dentition. No lesions. Heart:  S1, S2 present without murmurs, rubs, or gallops. Regular rate and rhythm. Abdomen:  +BS, soft, non-tender and non-distended. Large AP diameter, difficult to appreciate HSM due to body habitus.  Msk:  Symmetrical without gross deformities. Normal posture. Extremities:  2+ chronic lower extremity edema.  Neurologic:  Alert and  oriented x4;  grossly normal neurologically. Skin:  Intact without significant lesions or rashes. Psych:  Alert and cooperative. Normal mood and affect.

## 2013-05-13 NOTE — Assessment & Plan Note (Addendum)
In the setting of Plavix and aspirin, secondary to known internal hemorrhoids. Colonoscopy up-to-date. Plavix and aspirin on hold per Dr. Legrand Rams since 05/06/13. Resolution of bleeding since starting suppositories and stopping Plavix. Discussed outpatient banding procedure with patient and wife in detail. Would be a good candidate but would need to be off these agents post-procedure for 10 days per Dr. Gala Romney. Will discuss with Dr. Gala Romney the best timing to pursue this. Patient and wife stated understanding and are willing to proceed when an appt is available. Further recommendations to follow.

## 2013-05-13 NOTE — Progress Notes (Signed)
cc'd to pcp 

## 2013-05-19 ENCOUNTER — Encounter: Payer: Self-pay | Admitting: Internal Medicine

## 2013-05-25 ENCOUNTER — Encounter: Payer: Medicare Other | Admitting: Internal Medicine

## 2013-06-25 ENCOUNTER — Encounter (INDEPENDENT_AMBULATORY_CARE_PROVIDER_SITE_OTHER): Payer: Self-pay

## 2013-06-25 ENCOUNTER — Ambulatory Visit (INDEPENDENT_AMBULATORY_CARE_PROVIDER_SITE_OTHER): Payer: Medicare Other | Admitting: Internal Medicine

## 2013-06-25 ENCOUNTER — Encounter: Payer: Self-pay | Admitting: Internal Medicine

## 2013-06-25 VITALS — BP 149/84 | HR 65 | Temp 97.9°F | Wt 267.6 lb

## 2013-06-25 DIAGNOSIS — Z860100 Personal history of colon polyps, unspecified: Secondary | ICD-10-CM | POA: Insufficient documentation

## 2013-06-25 DIAGNOSIS — K648 Other hemorrhoids: Secondary | ICD-10-CM | POA: Insufficient documentation

## 2013-06-25 NOTE — Progress Notes (Signed)
Primary Care Physician:  Rosita Fire, MD Primary Gastroenterologist:  Dr. Gala Romney  Pre-Procedure History & Physical: HPI:  [name] is a 73 y.o. male here for followup of hematochezia secondary to symptomatic hemorrhoids. Underwent  banding ( 2 bands)-1 band on each of 2 columns simultaneously on January 22. He was off aspirin and Plavix - now he's back on. He's done extremely well. No further rectal bleeding or any anorectal symptoms. Last hemoglobin 13.7 back in December of last year. He is very happy. He wants his third columns banded today. History of colonic adenomas removed in last year; he will be due for surveillance colonoscopy no sooner than 2019.  Past Medical History  Diagnosis Date  . Bladder cancer   . Stroke 3/09  . Prostate cancer   . Hypertension     Past Surgical History  Procedure Laterality Date  . Colonoscopy  12/23/2007    RMR: Normal rectum.  Pedunculated polyp, distal transverse colon, status post hot snare removal.  Remainder of colonic mucosa and terminal mucosa appeared normal. path with multiple fragments of tubular adenoma  . Colonoscopy  12/08/2002    [id number] internal hemorrhoids, otherwise, normal colonoscopy  . Colonoscopy  2001    per Dr. Olevia Perches note in 2004, large tubulovillous adenoma  . Back surgery  1996  . Prostate surgery  2000  . Colonoscopy N/A 11/20/2012    [id number] diverticulosis. Colonic polyps-removed as described above. I suspect benign anorectal bleeding.  tubular adenomas. next TCS 11/2017.  Marland Kitchen Hemorrhoid banding  05/13/2013    Prior to Admission medications   Medication Sig Start Date End Date Taking? Authorizing Provider  amLODipine (NORVASC) 10 MG tablet Take 10 mg by mouth daily.  10/14/12  Yes Historical Provider, MD  aspirin 325 MG tablet Take 325 mg by mouth daily.   Yes Historical Provider, MD  cloNIDine (CATAPRES) 0.2 MG tablet Take 0.2 mg by mouth 2 (two) times daily.  08/21/12  Yes Historical Provider, MD  clopidogrel  (PLAVIX) 75 MG tablet Take 75 mg by mouth daily.   Yes Historical Provider, MD  furosemide (LASIX) 20 MG tablet Take 20 mg by mouth 3 (three) times daily.  10/26/12  Yes Historical Provider, MD  hydrALAZINE (APRESOLINE) 50 MG tablet Take 50 mg by mouth 3 (three) times daily.  09/07/12  Yes Historical Provider, MD  lisinopril (PRINIVIL,ZESTRIL) 40 MG tablet Take 40 mg by mouth daily.  09/21/12  Yes Historical Provider, MD  omeprazole (PRILOSEC) 20 MG capsule Take 20 mg by mouth daily.  11/16/12  Yes Historical Provider, MD  traMADol (ULTRAM) 50 MG tablet Take 50 mg by mouth 2 (two) times daily.  05/11/13  Yes Historical Provider, MD    Allergies as of 06/25/2013  . (No Known Allergies)    Family History  Problem Relation Age of Onset  . Colon cancer Neg Hx     History   Social History  . Marital Status: Married    Spouse Name: N/A    Number of Children: N/A  . Years of Education: N/A   Occupational History  . Not on file.   Social History Main Topics  . Smoking status: Former Research scientist (life sciences)  . Smokeless tobacco: Not on file     Comment: quit in 2009  . Alcohol Use: No  . Drug Use: No  . Sexual Activity: Not on file   Other Topics Concern  . Not on file   Social History Narrative  . No narrative on file  Review of Systems: See HPI, otherwise negative ROS  Physical Exam: BP 149/84  Pulse 65  Temp(Src) 97.9 F (36.6 C) (Oral)  Wt 267 lb 9.6 oz (121.383 kg) General:   Alert,  Well-developed, well-nourished, pleasant and cooperative in NAD. Accompanied by wife. Skin:  Intact without significant lesions or rashes. Eyes:  Sclera clear, no icterus.   Conjunctiva pink. Ears:  Normal auditory acuity. Nose:  No deformity, discharge,  or lesions. Mouth:  No deformity or lesions. Neck:  Supple; no masses or thyromegaly. No significant cervical adenopathy. Lungs:  Clear throughout to auscultation.   No wheezes, crackles, or rhonchi. No acute distress. Heart:  Regular rate and rhythm;  no murmurs, clicks, rubs,  or gallops. Abdomen: Obese.  Positive bowel sounds.  Soft and nontender without appreciable mass or hepatosplenomegaly.  Pulses:  Normal pulses noted. Extremities:  Without clubbing or edema.  Impression/Plan:  Very pleasant 73 year old gentleman with an excellent response from hemorrhoid banding recently. We had a window off Plavix and aspirin. He underwent 2 bands simultaneously. He now has no further symptoms referrable to hemorrhoids. I told him that we do not treat asymptomatic hemorrhoids. It would be problematic on aspirin/ Plavix anyway. I told him so long as he has no further hemorrhoid symptoms, we do not need to intervene further.  Recommendations:  Avoid straining.  Benefiber 2 teaspoons twice daily  Limit toilet time to 2-3 minutes  Colonoscopy 2019  Follow-up as needed  Call if any future GI problems

## 2013-06-25 NOTE — Patient Instructions (Signed)
Avoid straining.  Benefiber 2 teaspoons twice daily  Limit toilet time to 2-3 minutes  Call with any interim problems  Colonoscopy 2019  Follow-up as needed

## 2014-05-03 DIAGNOSIS — H2513 Age-related nuclear cataract, bilateral: Secondary | ICD-10-CM | POA: Diagnosis not present

## 2014-05-03 DIAGNOSIS — H524 Presbyopia: Secondary | ICD-10-CM | POA: Diagnosis not present

## 2014-05-03 DIAGNOSIS — H5203 Hypermetropia, bilateral: Secondary | ICD-10-CM | POA: Diagnosis not present

## 2014-05-03 DIAGNOSIS — H4011X1 Primary open-angle glaucoma, mild stage: Secondary | ICD-10-CM | POA: Diagnosis not present

## 2014-05-16 DIAGNOSIS — M1990 Unspecified osteoarthritis, unspecified site: Secondary | ICD-10-CM | POA: Diagnosis not present

## 2014-05-16 DIAGNOSIS — I10 Essential (primary) hypertension: Secondary | ICD-10-CM | POA: Diagnosis not present

## 2014-06-07 DIAGNOSIS — M1990 Unspecified osteoarthritis, unspecified site: Secondary | ICD-10-CM | POA: Diagnosis not present

## 2014-06-07 DIAGNOSIS — I10 Essential (primary) hypertension: Secondary | ICD-10-CM | POA: Diagnosis not present

## 2014-07-13 DIAGNOSIS — I10 Essential (primary) hypertension: Secondary | ICD-10-CM | POA: Diagnosis not present

## 2014-07-13 DIAGNOSIS — M1990 Unspecified osteoarthritis, unspecified site: Secondary | ICD-10-CM | POA: Diagnosis not present

## 2014-08-09 DIAGNOSIS — H4011X1 Primary open-angle glaucoma, mild stage: Secondary | ICD-10-CM | POA: Diagnosis not present

## 2014-08-13 DIAGNOSIS — I10 Essential (primary) hypertension: Secondary | ICD-10-CM | POA: Diagnosis not present

## 2014-08-13 DIAGNOSIS — M1990 Unspecified osteoarthritis, unspecified site: Secondary | ICD-10-CM | POA: Diagnosis not present

## 2014-08-31 DIAGNOSIS — G8191 Hemiplegia, unspecified affecting right dominant side: Secondary | ICD-10-CM | POA: Diagnosis not present

## 2014-08-31 DIAGNOSIS — I10 Essential (primary) hypertension: Secondary | ICD-10-CM | POA: Diagnosis not present

## 2014-08-31 DIAGNOSIS — R7309 Other abnormal glucose: Secondary | ICD-10-CM | POA: Diagnosis not present

## 2014-10-01 DIAGNOSIS — I10 Essential (primary) hypertension: Secondary | ICD-10-CM | POA: Diagnosis not present

## 2014-10-01 DIAGNOSIS — M1990 Unspecified osteoarthritis, unspecified site: Secondary | ICD-10-CM | POA: Diagnosis not present

## 2014-10-31 DIAGNOSIS — M1990 Unspecified osteoarthritis, unspecified site: Secondary | ICD-10-CM | POA: Diagnosis not present

## 2014-10-31 DIAGNOSIS — I10 Essential (primary) hypertension: Secondary | ICD-10-CM | POA: Diagnosis not present

## 2014-11-21 DIAGNOSIS — H4011X1 Primary open-angle glaucoma, mild stage: Secondary | ICD-10-CM | POA: Diagnosis not present

## 2014-12-01 DIAGNOSIS — M1990 Unspecified osteoarthritis, unspecified site: Secondary | ICD-10-CM | POA: Diagnosis not present

## 2014-12-01 DIAGNOSIS — I10 Essential (primary) hypertension: Secondary | ICD-10-CM | POA: Diagnosis not present

## 2015-01-03 DIAGNOSIS — G8190 Hemiplegia, unspecified affecting unspecified side: Secondary | ICD-10-CM | POA: Diagnosis not present

## 2015-01-03 DIAGNOSIS — I6350 Cerebral infarction due to unspecified occlusion or stenosis of unspecified cerebral artery: Secondary | ICD-10-CM | POA: Diagnosis not present

## 2015-01-03 DIAGNOSIS — Z23 Encounter for immunization: Secondary | ICD-10-CM | POA: Diagnosis not present

## 2015-01-03 DIAGNOSIS — I10 Essential (primary) hypertension: Secondary | ICD-10-CM | POA: Diagnosis not present

## 2015-01-03 DIAGNOSIS — M1990 Unspecified osteoarthritis, unspecified site: Secondary | ICD-10-CM | POA: Diagnosis not present

## 2015-01-18 DIAGNOSIS — C61 Malignant neoplasm of prostate: Secondary | ICD-10-CM | POA: Diagnosis not present

## 2015-01-18 DIAGNOSIS — Z8551 Personal history of malignant neoplasm of bladder: Secondary | ICD-10-CM | POA: Diagnosis not present

## 2015-01-18 DIAGNOSIS — C679 Malignant neoplasm of bladder, unspecified: Secondary | ICD-10-CM | POA: Diagnosis not present

## 2015-02-02 DIAGNOSIS — I10 Essential (primary) hypertension: Secondary | ICD-10-CM | POA: Diagnosis not present

## 2015-02-02 DIAGNOSIS — M1990 Unspecified osteoarthritis, unspecified site: Secondary | ICD-10-CM | POA: Diagnosis not present

## 2015-03-05 DIAGNOSIS — I10 Essential (primary) hypertension: Secondary | ICD-10-CM | POA: Diagnosis not present

## 2015-03-05 DIAGNOSIS — M1990 Unspecified osteoarthritis, unspecified site: Secondary | ICD-10-CM | POA: Diagnosis not present

## 2015-03-06 ENCOUNTER — Other Ambulatory Visit (HOSPITAL_COMMUNITY): Payer: Self-pay | Admitting: Internal Medicine

## 2015-03-06 ENCOUNTER — Ambulatory Visit (HOSPITAL_COMMUNITY)
Admission: RE | Admit: 2015-03-06 | Discharge: 2015-03-06 | Disposition: A | Discharge status: Home / Self Care | Payer: Medicare Other | Source: Ambulatory Visit | Attending: Internal Medicine | Admitting: Internal Medicine

## 2015-03-06 DIAGNOSIS — M25512 Pain in left shoulder: Secondary | ICD-10-CM | POA: Diagnosis not present

## 2015-03-06 DIAGNOSIS — G8190 Hemiplegia, unspecified affecting unspecified side: Secondary | ICD-10-CM | POA: Diagnosis not present

## 2015-03-06 DIAGNOSIS — M19012 Primary osteoarthritis, left shoulder: Secondary | ICD-10-CM | POA: Diagnosis not present

## 2015-03-06 DIAGNOSIS — I10 Essential (primary) hypertension: Secondary | ICD-10-CM | POA: Diagnosis not present

## 2015-03-07 DIAGNOSIS — H401131 Primary open-angle glaucoma, bilateral, mild stage: Secondary | ICD-10-CM | POA: Diagnosis not present

## 2015-03-08 ENCOUNTER — Emergency Department (HOSPITAL_COMMUNITY)
Admission: EM | Admit: 2015-03-08 | Discharge: 2015-03-08 | Disposition: A | Discharge status: Home / Self Care | Payer: Medicare Other | Attending: Emergency Medicine | Admitting: Emergency Medicine

## 2015-03-08 ENCOUNTER — Encounter (HOSPITAL_COMMUNITY): Payer: Self-pay | Admitting: Emergency Medicine

## 2015-03-08 DIAGNOSIS — Z87891 Personal history of nicotine dependence: Secondary | ICD-10-CM | POA: Insufficient documentation

## 2015-03-08 DIAGNOSIS — M25512 Pain in left shoulder: Secondary | ICD-10-CM | POA: Insufficient documentation

## 2015-03-08 DIAGNOSIS — Z791 Long term (current) use of non-steroidal anti-inflammatories (NSAID): Secondary | ICD-10-CM | POA: Insufficient documentation

## 2015-03-08 DIAGNOSIS — I10 Essential (primary) hypertension: Secondary | ICD-10-CM | POA: Diagnosis not present

## 2015-03-08 DIAGNOSIS — Z7982 Long term (current) use of aspirin: Secondary | ICD-10-CM | POA: Diagnosis not present

## 2015-03-08 DIAGNOSIS — Z7902 Long term (current) use of antithrombotics/antiplatelets: Secondary | ICD-10-CM | POA: Insufficient documentation

## 2015-03-08 DIAGNOSIS — Z79899 Other long term (current) drug therapy: Secondary | ICD-10-CM | POA: Insufficient documentation

## 2015-03-08 DIAGNOSIS — Z8673 Personal history of transient ischemic attack (TIA), and cerebral infarction without residual deficits: Secondary | ICD-10-CM | POA: Insufficient documentation

## 2015-03-08 DIAGNOSIS — M6281 Muscle weakness (generalized): Secondary | ICD-10-CM | POA: Insufficient documentation

## 2015-03-08 DIAGNOSIS — Z8546 Personal history of malignant neoplasm of prostate: Secondary | ICD-10-CM | POA: Insufficient documentation

## 2015-03-08 MED ORDER — HYDROCODONE-ACETAMINOPHEN 5-325 MG PO TABS: 1.0000 | ORAL_TABLET | ORAL | Status: DC | PRN

## 2015-03-08 MED ORDER — HYDROCODONE-ACETAMINOPHEN 5-325 MG PO TABS
1.0000 | ORAL_TABLET | Freq: Once | ORAL | Status: AC
  Administered 2015-03-08: 1 via ORAL
  Filled 2015-03-08: qty 1

## 2015-03-08 NOTE — ED Provider Notes (Signed)
CSN: [id number]     Arrival date & time 03/08/15  [id number] History   First MD Initiated Contact with Patient 03/08/15 [phone]     Chief Complaint  Patient presents with  . Shoulder Pain     (Consider location/radiation/quality/duration/timing/severity/associated sxs/prior Treatment) The history is provided by the patient and the spouse.   [name] is a 74 y.o. male presenting with left shoulder pain which is been present for approximately the past 2 weeks, but is worsening over the past several days.  He describes constant throbbing pain which occasionally radiates into his mid upper arm and is worsened with movement, especially attempts to raise his arm over his head.  He denies any new weakness in his arm, but has chronic weakness secondary to a CVA in 2009.  He has seen his primary care doctor for this complaint, had x-rays completed 2 days ago showing mild AC and glenohumeral degenerative changes, and is awaiting a referring orthopedic appointment with Dr. Aline Brochure on December 1.  In the interim he is using tramadol which is not relieving his symptoms.  He denies any injuries to the left shoulder.  He also denies chest pain, shortness of breath, fevers or chills.  His wife made him a temporary splint last night which was somewhat effective.  Pain is worsened with attempts to lie down, even on his back.  Wife endorses he's been sleeping in a chair to help minimize pain.     Past Medical History  Diagnosis Date  . Bladder cancer ([place])   . Stroke ([place]) 3/09  . Prostate cancer ([place])   . Hypertension    Past Surgical History  Procedure Laterality Date  . Colonoscopy  12/23/2007    RMR: Normal rectum.  Pedunculated polyp, distal transverse colon, status post hot snare removal.  Remainder of colonic mucosa and terminal mucosa appeared normal. path with multiple fragments of tubular adenoma  . Colonoscopy  12/08/2002    [id number] internal hemorrhoids, otherwise, normal colonoscopy  .  Colonoscopy  2001    per Dr. Olevia Perches note in 2004, large tubulovillous adenoma  . Back surgery  1996  . Prostate surgery  2000  . Colonoscopy N/A 11/20/2012    [id number] diverticulosis. Colonic polyps-removed as described above. I suspect benign anorectal bleeding.  tubular adenomas. next TCS 11/2017.  Marland Kitchen Hemorrhoid banding  05/13/2013   Family History  Problem Relation Age of Onset  . Colon cancer Neg Hx    Social History  Substance Use Topics  . Smoking status: Former Research scientist (life sciences)  . Smokeless tobacco: None     Comment: quit in 2009  . Alcohol Use: No    Review of Systems  Constitutional: Negative for fever.  Musculoskeletal: Positive for arthralgias. Negative for myalgias and joint swelling.  Neurological: Negative for weakness and numbness.      Allergies  Review of patient's allergies indicates no known allergies.  Home Medications   Prior to Admission medications   Medication Sig Start Date End Date Taking? Authorizing Provider  amLODipine (NORVASC) 10 MG tablet Take 10 mg by mouth daily.  10/14/12   Historical Provider, MD  aspirin 325 MG tablet Take 325 mg by mouth daily.    Historical Provider, MD  cloNIDine (CATAPRES) 0.2 MG tablet Take 0.2 mg by mouth 2 (two) times daily.  08/21/12   Historical Provider, MD  clopidogrel (PLAVIX) 75 MG tablet Take 75 mg by mouth daily.    Historical Provider, MD  furosemide (LASIX) 20 MG tablet Take 20  mg by mouth 3 (three) times daily.  10/26/12   Historical Provider, MD  hydrALAZINE (APRESOLINE) 50 MG tablet Take 50 mg by mouth 3 (three) times daily.  09/07/12   Historical Provider, MD  lisinopril (PRINIVIL,ZESTRIL) 40 MG tablet Take 40 mg by mouth daily.  09/21/12   Historical Provider, MD  omeprazole (PRILOSEC) 20 MG capsule Take 20 mg by mouth daily.  11/16/12   Historical Provider, MD  traMADol (ULTRAM) 50 MG tablet Take 50 mg by mouth 2 (two) times daily.  05/11/13   Historical Provider, MD   BP 141/77 mmHg  Pulse 61  Temp(Src) 97.7 F  (36.5 C) (Oral)  Resp 18  Ht 5\' 7"  (1.702 m)  Wt 260 lb (117.935 kg)  BMI 40.71 kg/m2  SpO2 96% Physical Exam  Constitutional: He appears well-developed and well-nourished.  HENT:  Head: Atraumatic.  Neck: Normal range of motion.  Cardiovascular: Normal rate and regular rhythm.   Pulses:      Radial pulses are 2+ on the left side.  Pulses equal bilaterally  Pulmonary/Chest: He has no decreased breath sounds. He has no wheezes. He has no rhonchi. He has no rales.  Musculoskeletal: He exhibits tenderness.       Left shoulder: He exhibits decreased range of motion, bony tenderness and decreased strength. He exhibits no swelling, no effusion, no crepitus, no deformity, no spasm and normal pulse.  Tender to palpation along anterior shoulder including AC joint.  No crepitus with range of motion.  Pain is equal with both active and passive range of motion.  Neurological: He is alert. He has normal strength. He displays normal reflexes. No sensory deficit.  Skin: Skin is warm and dry.  Psychiatric: He has a normal mood and affect.    ED Course  Procedures (including critical care time) Labs Review Labs Reviewed - No data to display  Imaging Review Dg Shoulder Left  03/06/2015  CLINICAL DATA:  Shoulder pain for 2 weeks.  No known injury. EXAM: LEFT SHOULDER - 2+ VIEW COMPARISON:  None. FINDINGS: The joint spaces are maintained. Mild degenerative changes. No acute fracture or worrisome bone lesion. The visualized ribs and lung are unremarkable. IMPRESSION: Mild glenohumeral and AC joint degenerative changes but no acute bony abnormality. Electronically Signed   By: Marijo Sanes M.D.   On: 03/06/2015 15:05   I have personally reviewed and evaluated these images and lab results as part of my medical decision-making.   EKG Interpretation None      MDM   Final diagnoses:  None    Pt with reproducible left shoulder joint pain, no cp,no sob, recent imaging revealing mild degenerative  changes.  Tramadol not effective for pain.  Hydrocodone prescribed, sling provided, advised to maintain ROM of joint, but wear sling for comfort.  F/u with ortho as scheduled.  Advised to watch for constipation with hydrocodone, may need to adjust his miralax which he currently takes for constipation.    Evalee Jefferson, PA-C 03/08/15 [id number]  Jola Schmidt, MD 03/08/15 [phone]

## 2015-03-08 NOTE — ED Notes (Signed)
MD Campos at bedside.  

## 2015-03-08 NOTE — ED Notes (Signed)
PA Julie Idol at bedside. 

## 2015-03-08 NOTE — ED Notes (Signed)
Pt c/o of increasingly worse LT shoulder pain. Pt reports seeing MD Fanta on Monday, xrays were taken of shoulder. Pt referred to MD Aline Brochure, but cannot see him until Dec 1st. Pt states pain has become unbearable. Pt able to move extremity. Denies recent injury.

## 2015-03-08 NOTE — ED Notes (Signed)
PA Evalee Jefferson at bedside updating patient and family.

## 2015-03-08 NOTE — Discharge Instructions (Signed)
Heat Therapy Heat therapy can help ease sore, stiff, injured, and tight muscles and joints. Heat relaxes your muscles, which may help ease your pain. Heat therapy should only be used on old, pre-existing, or long-lasting (chronic) injuries. Do not use heat therapy unless told by your doctor. HOW TO USE HEAT THERAPY There are several different kinds of heat therapy, including:  Moist heat pack.  Warm water bath.  Hot water bottle.  Electric heating pad.  Heated gel pack.  Heated wrap.  Electric heating pad. GENERAL HEAT THERAPY RECOMMENDATIONS   Do not sleep while using heat therapy. Only use heat therapy while you are awake.  Your skin may turn pink while using heat therapy. Do not use heat therapy if your skin turns red.  Do not use heat therapy if you have new pain.  High heat or long exposure to heat can cause burns. Be careful when using heat therapy to avoid burning your skin.  Do not use heat therapy on areas of your skin that are already irritated, such as with a rash or sunburn. GET HELP IF:   You have blisters, redness, swelling (puffiness), or numbness.  You have new pain.  Your pain is worse. MAKE SURE YOU:  Understand these instructions.  Will watch your condition.  Will get help right away if you are not doing well or get worse.   This information is not intended to replace advice given to you by your health care provider. Make sure you discuss any questions you have with your health care provider.   Document Released: 07/01/2011 Document Revised: 04/29/2014 Document Reviewed: 06/01/2013 Elsevier Interactive Patient Education 2016 [employer] Pain Joint pain, which is also called arthralgia, can be caused by many things. Joint pain often goes away when you follow your health care provider's instructions for relieving pain at home. However, joint pain can also be caused by conditions that require further treatment. Common causes of joint pain  include:  Bruising in the area of the joint.  Overuse of the joint.  Wear and tear on the joints that occur with aging (osteoarthritis).  Various other forms of arthritis.  A buildup of a crystal form of uric acid in the joint (gout).  Infections of the joint (septic arthritis) or of the bone (osteomyelitis). Your health care provider may recommend medicine to help with the pain. If your joint pain continues, additional tests may be needed to diagnose your condition. HOME CARE INSTRUCTIONS Watch your condition for any changes. Follow these instructions as directed to lessen the pain that you are feeling.  Take medicines only as directed by your health care provider.  Rest the affected area for as long as your health care provider says that you should. If directed to do so, raise the painful joint above the level of your heart while you are sitting or lying down.  Do not do things that cause or worsen pain.  If directed, apply ice to the painful area:  Put ice in a plastic bag.  Place a towel between your skin and the bag.  Leave the ice on for 20 minutes, 2-3 times per day.  Wear an elastic bandage, splint, or sling as directed by your health care provider. Loosen the elastic bandage or splint if your fingers or toes become numb and tingle, or if they turn cold and blue.  Begin exercising or stretching the affected area as directed by your health care provider. Ask your health care provider what types  of exercise are safe for you.  Keep all follow-up visits as directed by your health care provider. This is important. SEEK MEDICAL CARE IF:  Your pain increases, and medicine does not help.  Your joint pain does not improve within 3 days.  You have increased bruising or swelling.  You have a fever.  You lose 10 lb (4.5 kg) or more without trying. SEEK IMMEDIATE MEDICAL CARE IF:  You are not able to move the joint.  Your fingers or toes become numb or they turn cold and  blue.   This information is not intended to replace advice given to you by your health care provider. Make sure you discuss any questions you have with your health care provider.   Document Released: 04/08/2005 Document Revised: 04/29/2014 Document Reviewed: 01/18/2014 Elsevier Interactive Patient Education 2016 [place] may take the hydrocodone prescribed for pain relief in place of your tramadol.  This will make you drowsy - do not drive within 4 hours of taking this medication.  Watch for any signs of worsening constipation with this medication.  You may need to take an extra dose of MiraLAX daily if you develop worsening constipation.

## 2015-03-14 ENCOUNTER — Encounter: Payer: Self-pay | Admitting: Orthopedic Surgery

## 2015-03-14 ENCOUNTER — Ambulatory Visit (INDEPENDENT_AMBULATORY_CARE_PROVIDER_SITE_OTHER): Payer: Medicare Other | Admitting: Orthopedic Surgery

## 2015-03-14 VITALS — BP 157/86 | Ht 67.0 in | Wt 270.0 lb

## 2015-03-14 DIAGNOSIS — M7542 Impingement syndrome of left shoulder: Secondary | ICD-10-CM | POA: Diagnosis not present

## 2015-03-14 NOTE — Patient Instructions (Signed)

## 2015-03-21 NOTE — Progress Notes (Signed)
Chief Complaint  Patient presents with  . Shoulder Pain    Referred by DR Legrand Rams for left shoulder pain    Left shoulder pain times several weeks. Pain is a dull ache moderate in severity associated with motion  Review of systems numbness tingling negative. Skin rash negative. Lymph nodes axilla. Negative.  Past Medical History  Diagnosis Date  . Bladder cancer ([place])   . Stroke ([place]) 3/09  . Prostate cancer ([place])   . Hypertension     BP 157/86 mmHg  Ht 5\' 7"  (1.702 m)  Wt 270 lb (122.471 kg)  BMI 42.28 kg/m2  Normal hygiene and grooming. Oriented 3. Mood normal. Appearance normal. Gait normal. Left shoulder decreased range of motion but normal stability rotator cuff weakness mild. Tenderness around the shoulder primarily in the deltoid and anterior joint line. Impingement sign positive. Skin normal. Lymph nodes normal. Sensation normal. Pulses normal. Opposite shoulder full range of motion.  A dependent x-ray interpretation no fracture normal shoulder  Bursitis shoulder  Recommend conservative treatment. Exercise, injection, strengthening.  Procedure note the subacromial injection shoulder left   Verbal consent was obtained to inject the  Left   Shoulder  Timeout was completed to confirm the injection site is a subacromial space of the  left  shoulder  Medication used Depo-Medrol 40 mg and lidocaine 1% 3 cc  Anesthesia was provided by ethyl chloride  The injection was performed in the left  posterior subacromial space. After pinning the skin with alcohol and anesthetized the skin with ethyl chloride the subacromial space was injected using a 20-gauge needle. There were no complications  Sterile dressing was applied.

## 2015-03-23 ENCOUNTER — Ambulatory Visit: Payer: Medicare Other | Admitting: Orthopedic Surgery

## 2015-05-09 DIAGNOSIS — G8190 Hemiplegia, unspecified affecting unspecified side: Secondary | ICD-10-CM | POA: Diagnosis not present

## 2015-05-09 DIAGNOSIS — I10 Essential (primary) hypertension: Secondary | ICD-10-CM | POA: Diagnosis not present

## 2015-06-05 DIAGNOSIS — I10 Essential (primary) hypertension: Secondary | ICD-10-CM | POA: Diagnosis not present

## 2015-06-05 DIAGNOSIS — E1165 Type 2 diabetes mellitus with hyperglycemia: Secondary | ICD-10-CM | POA: Diagnosis not present

## 2015-06-05 DIAGNOSIS — R7309 Other abnormal glucose: Secondary | ICD-10-CM | POA: Diagnosis not present

## 2015-06-05 DIAGNOSIS — R739 Hyperglycemia, unspecified: Secondary | ICD-10-CM | POA: Diagnosis not present

## 2015-07-04 DIAGNOSIS — R7309 Other abnormal glucose: Secondary | ICD-10-CM | POA: Diagnosis not present

## 2015-07-04 DIAGNOSIS — G8190 Hemiplegia, unspecified affecting unspecified side: Secondary | ICD-10-CM | POA: Diagnosis not present

## 2015-07-04 DIAGNOSIS — C61 Malignant neoplasm of prostate: Secondary | ICD-10-CM | POA: Diagnosis not present

## 2015-07-04 DIAGNOSIS — I10 Essential (primary) hypertension: Secondary | ICD-10-CM | POA: Diagnosis not present

## 2015-07-04 DIAGNOSIS — E1165 Type 2 diabetes mellitus with hyperglycemia: Secondary | ICD-10-CM | POA: Diagnosis not present

## 2015-07-18 DIAGNOSIS — E119 Type 2 diabetes mellitus without complications: Secondary | ICD-10-CM | POA: Diagnosis not present

## 2015-07-18 DIAGNOSIS — H524 Presbyopia: Secondary | ICD-10-CM | POA: Diagnosis not present

## 2015-07-18 DIAGNOSIS — H25013 Cortical age-related cataract, bilateral: Secondary | ICD-10-CM | POA: Diagnosis not present

## 2015-07-18 DIAGNOSIS — H2513 Age-related nuclear cataract, bilateral: Secondary | ICD-10-CM | POA: Diagnosis not present

## 2015-07-18 DIAGNOSIS — H401132 Primary open-angle glaucoma, bilateral, moderate stage: Secondary | ICD-10-CM | POA: Diagnosis not present

## 2015-08-04 DIAGNOSIS — I10 Essential (primary) hypertension: Secondary | ICD-10-CM | POA: Diagnosis not present

## 2015-08-04 DIAGNOSIS — E1165 Type 2 diabetes mellitus with hyperglycemia: Secondary | ICD-10-CM | POA: Diagnosis not present

## 2015-09-03 DIAGNOSIS — E1165 Type 2 diabetes mellitus with hyperglycemia: Secondary | ICD-10-CM | POA: Diagnosis not present

## 2015-09-03 DIAGNOSIS — I6350 Cerebral infarction due to unspecified occlusion or stenosis of unspecified cerebral artery: Secondary | ICD-10-CM | POA: Diagnosis not present

## 2015-10-03 DIAGNOSIS — I6350 Cerebral infarction due to unspecified occlusion or stenosis of unspecified cerebral artery: Secondary | ICD-10-CM | POA: Diagnosis not present

## 2015-10-03 DIAGNOSIS — E1165 Type 2 diabetes mellitus with hyperglycemia: Secondary | ICD-10-CM | POA: Diagnosis not present

## 2015-11-03 DIAGNOSIS — I10 Essential (primary) hypertension: Secondary | ICD-10-CM | POA: Diagnosis not present

## 2015-11-03 DIAGNOSIS — I6350 Cerebral infarction due to unspecified occlusion or stenosis of unspecified cerebral artery: Secondary | ICD-10-CM | POA: Diagnosis not present

## 2015-11-07 DIAGNOSIS — H401133 Primary open-angle glaucoma, bilateral, severe stage: Secondary | ICD-10-CM | POA: Diagnosis not present

## 2015-11-07 DIAGNOSIS — H2513 Age-related nuclear cataract, bilateral: Secondary | ICD-10-CM | POA: Diagnosis not present

## 2015-11-07 DIAGNOSIS — H2511 Age-related nuclear cataract, right eye: Secondary | ICD-10-CM | POA: Diagnosis not present

## 2015-11-07 NOTE — Patient Instructions (Signed)
Your procedure is scheduled on: 11/14/2015   Report to [facility] at  23  AM.  Call this number if you have problems the morning of surgery: [phone]   Do not eat food or drink liquids :After Midnight.      Take these medicines the morning of surgery with A SIP OF WATER: norvasc, catapress, hydrocodone, lisinopril, prilosec, ultram.   Do not wear jewelry, make-up or nail polish.  Do not wear lotions, powders, or perfumes. You may wear deodorant.  Do not shave 48 hours prior to surgery.  Do not bring valuables to the hospital.  Contacts, dentures or bridgework may not be worn into surgery.  Leave suitcase in the car. After surgery it may be brought to your room.  For patients admitted to the hospital, checkout time is 11:00 AM the day of discharge.   Patients discharged the day of surgery will not be allowed to drive home.  :     Please read over the following fact sheets that you were given: Coughing and Deep Breathing, Surgical Site Infection Prevention, Anesthesia Post-op Instructions and Care and Recovery After Surgery    Cataract A cataract is a clouding of the lens of the eye. When a lens becomes cloudy, vision is reduced based on the degree and nature of the clouding. Many cataracts reduce vision to some degree. Some cataracts make people more near-sighted as they develop. Other cataracts increase glare. Cataracts that are ignored and become worse can sometimes look white. The white color can be seen through the pupil. CAUSES   Aging. However, cataracts may occur at any age, even in newborns.   Certain drugs.   Trauma to the eye.   Certain diseases such as diabetes.   Specific eye diseases such as chronic inflammation inside the eye or a sudden attack of a rare form of glaucoma.   Inherited or acquired medical problems.  SYMPTOMS   Gradual, progressive drop in vision in the affected eye.   Severe, rapid visual loss. This most often happens when trauma is the cause.    DIAGNOSIS  To detect a cataract, an eye doctor examines the lens. Cataracts are best diagnosed with an exam of the eyes with the pupils enlarged (dilated) by drops.  TREATMENT  For an early cataract, vision may improve by using different eyeglasses or stronger lighting. If that does not help your vision, surgery is the only effective treatment. A cataract needs to be surgically removed when vision loss interferes with your everyday activities, such as driving, reading, or watching TV. A cataract may also have to be removed if it prevents examination or treatment of another eye problem. Surgery removes the cloudy lens and usually replaces it with a substitute lens (intraocular lens, IOL).  At a time when both you and your doctor agree, the cataract will be surgically removed. If you have cataracts in both eyes, only one is usually removed at a time. This allows the operated eye to heal and be out of danger from any possible problems after surgery (such as infection or poor wound healing). In rare cases, a cataract may be doing damage to your eye. In these cases, your caregiver may advise surgical removal right away. The vast majority of people who have cataract surgery have better vision afterward. HOME CARE INSTRUCTIONS  If you are not planning surgery, you may be asked to do the following:  Use different eyeglasses.   Use stronger or brighter lighting.   Ask your eye  doctor about reducing your medicine dose or changing medicines if it is thought that a medicine caused your cataract. Changing medicines does not make the cataract go away on its own.   Become familiar with your surroundings. Poor vision can lead to injury. Avoid bumping into things on the affected side. You are at a higher risk for tripping or falling.   Exercise extreme care when driving or operating machinery.   Wear sunglasses if you are sensitive to bright light or experiencing problems with glare.  SEEK IMMEDIATE MEDICAL CARE  IF:   You have a worsening or sudden vision loss.   You notice redness, swelling, or increasing pain in the eye.   You have a fever.  Document Released: 04/08/2005 Document Revised: 03/28/2011 Document Reviewed: 11/30/2010 [facility] Patient Information 2012 [place].PATIENT INSTRUCTIONS POST-ANESTHESIA  IMMEDIATELY FOLLOWING SURGERY:  Do not drive or operate machinery for the first twenty four hours after surgery.  Do not make any important decisions for twenty four hours after surgery or while taking narcotic pain medications or sedatives.  If you develop intractable nausea and vomiting or a severe headache please notify your doctor immediately.  FOLLOW-UP:  Please make an appointment with your surgeon as instructed. You do not need to follow up with anesthesia unless specifically instructed to do so.  WOUND CARE INSTRUCTIONS (if applicable):  Keep a dry clean dressing on the anesthesia/puncture wound site if there is drainage.  Once the wound has quit draining you may leave it open to air.  Generally you should leave the bandage intact for twenty four hours unless there is drainage.  If the epidural site drains for more than 36-48 hours please call the anesthesia department.  QUESTIONS?:  Please feel free to call your physician or the hospital operator if you have any questions, and they will be happy to assist you.

## 2015-11-08 ENCOUNTER — Encounter (HOSPITAL_COMMUNITY)
Admission: RE | Admit: 2015-11-08 | Discharge: 2015-11-08 | Disposition: A | Discharge status: Home / Self Care | Payer: Medicare Other | Source: Ambulatory Visit | Attending: Ophthalmology | Admitting: Ophthalmology

## 2015-11-08 ENCOUNTER — Encounter (HOSPITAL_COMMUNITY): Payer: Self-pay

## 2015-11-08 DIAGNOSIS — Z01812 Encounter for preprocedural laboratory examination: Secondary | ICD-10-CM | POA: Diagnosis not present

## 2015-11-08 DIAGNOSIS — Z01810 Encounter for preprocedural cardiovascular examination: Secondary | ICD-10-CM | POA: Diagnosis not present

## 2015-11-08 HISTORY — DX: Unspecified osteoarthritis, unspecified site: M19.90

## 2015-11-08 HISTORY — DX: Gastro-esophageal reflux disease without esophagitis: K21.9

## 2015-11-08 LAB — BASIC METABOLIC PANEL
Anion gap: 5 (ref 5–15)
BUN: 19 mg/dL (ref 6–20)
CHLORIDE: 106 mmol/L (ref 101–111)
CO2: 28 mmol/L (ref 22–32)
CREATININE: 0.98 mg/dL (ref 0.61–1.24)
Calcium: 8.4 mg/dL — ABNORMAL LOW (ref 8.9–10.3)
GFR calc non Af Amer: >60 mL/min (ref >60)
Glucose, Bld: 96 mg/dL (ref 65–99)
POTASSIUM: 3.8 mmol/L (ref 3.5–5.1)
Sodium: 139 mmol/L (ref 135–145)

## 2015-11-08 LAB — CBC WITH DIFFERENTIAL/PLATELET
BASOS ABS: 0 10*3/uL (ref 0.0–0.1)
BASOS PCT: 1 %
EOS ABS: 0.2 10*3/uL (ref 0.0–0.7)
Eosinophils Relative: 3 %
HCT: 39.3 % (ref 39.0–52.0)
HEMOGLOBIN: 13 g/dL (ref 13.0–17.0)
Lymphocytes Relative: 33 %
Lymphs Abs: 2.2 10*3/uL (ref 0.7–4.0)
MCH: 30.3 pg (ref 26.0–34.0)
MCHC: 33.1 g/dL (ref 30.0–36.0)
MCV: 91.6 fL (ref 78.0–100.0)
MONOS PCT: 6 %
Monocytes Absolute: 0.4 10*3/uL (ref 0.1–1.0)
NEUTROS PCT: 57 %
Neutro Abs: 3.8 10*3/uL (ref 1.7–7.7)
Platelets: 205 10*3/uL (ref 150–400)
RBC: 4.29 MIL/uL (ref 4.22–5.81)
RDW: 14.9 % (ref 11.5–15.5)
WBC: 6.7 10*3/uL (ref 4.0–10.5)

## 2015-11-08 NOTE — Progress Notes (Signed)
   11/08/15 1543  OBSTRUCTIVE SLEEP APNEA  Have you ever been diagnosed with sleep apnea through a sleep study? No  Do you snore loudly (loud enough to be heard through closed doors)?  0  Do you often feel tired, fatigued, or sleepy during the daytime (such as falling asleep during driving or talking to someone)? 0  Has anyone observed you stop breathing during your sleep? 0  Do you have, or are you being treated for high blood pressure? 1  BMI more than 35 kg/m2? 1  Age > 50 (1-yes) 1  Neck circumference greater than:Male 16 inches or larger, Male 17inches or larger? 1  Male Gender (Yes=1) 1  Obstructive Sleep Apnea Score 5  Score 5 or greater  Results sent to PCP

## 2015-11-08 NOTE — Pre-Procedure Instructions (Signed)
Patient given information to sign up for my chart at home. 

## 2015-11-13 MED ORDER — PHENYLEPHRINE HCL 2.5 % OP SOLN
OPHTHALMIC | Status: AC
  Filled 2015-11-13: qty 15

## 2015-11-13 MED ORDER — KETOROLAC TROMETHAMINE 0.5 % OP SOLN
OPHTHALMIC | Status: AC
  Filled 2015-11-13: qty 5

## 2015-11-13 MED ORDER — TETRACAINE HCL 0.5 % OP SOLN
OPHTHALMIC | Status: AC
  Filled 2015-11-13: qty 4

## 2015-11-13 MED ORDER — CYCLOPENTOLATE-PHENYLEPHRINE OP SOLN OPTIME - NO CHARGE
OPHTHALMIC | Status: AC
  Filled 2015-11-13: qty 2

## 2015-11-14 ENCOUNTER — Ambulatory Visit (HOSPITAL_COMMUNITY)
Admission: RE | Admit: 2015-11-14 | Discharge: 2015-11-14 | Disposition: A | Discharge status: Home / Self Care | Payer: Medicare Other | Source: Ambulatory Visit | Attending: Ophthalmology | Admitting: Ophthalmology

## 2015-11-14 ENCOUNTER — Encounter (HOSPITAL_COMMUNITY): Payer: Self-pay | Admitting: Anesthesiology

## 2015-11-14 ENCOUNTER — Encounter (HOSPITAL_COMMUNITY)
Admission: RE | Disposition: A | Discharge status: Home / Self Care | Payer: Self-pay | Source: Ambulatory Visit | Attending: Ophthalmology

## 2015-11-14 ENCOUNTER — Ambulatory Visit (HOSPITAL_COMMUNITY): Payer: Medicare Other | Admitting: Anesthesiology

## 2015-11-14 DIAGNOSIS — E1136 Type 2 diabetes mellitus with diabetic cataract: Secondary | ICD-10-CM | POA: Diagnosis not present

## 2015-11-14 DIAGNOSIS — I209 Angina pectoris, unspecified: Secondary | ICD-10-CM | POA: Insufficient documentation

## 2015-11-14 DIAGNOSIS — H2511 Age-related nuclear cataract, right eye: Secondary | ICD-10-CM | POA: Diagnosis not present

## 2015-11-14 DIAGNOSIS — Z7984 Long term (current) use of oral hypoglycemic drugs: Secondary | ICD-10-CM | POA: Diagnosis not present

## 2015-11-14 DIAGNOSIS — I10 Essential (primary) hypertension: Secondary | ICD-10-CM | POA: Diagnosis not present

## 2015-11-14 DIAGNOSIS — Z7982 Long term (current) use of aspirin: Secondary | ICD-10-CM | POA: Diagnosis not present

## 2015-11-14 DIAGNOSIS — Z79899 Other long term (current) drug therapy: Secondary | ICD-10-CM | POA: Insufficient documentation

## 2015-11-14 DIAGNOSIS — F419 Anxiety disorder, unspecified: Secondary | ICD-10-CM | POA: Insufficient documentation

## 2015-11-14 DIAGNOSIS — K219 Gastro-esophageal reflux disease without esophagitis: Secondary | ICD-10-CM | POA: Insufficient documentation

## 2015-11-14 DIAGNOSIS — I6990 Unspecified sequelae of unspecified cerebrovascular disease: Secondary | ICD-10-CM | POA: Diagnosis not present

## 2015-11-14 DIAGNOSIS — Z87891 Personal history of nicotine dependence: Secondary | ICD-10-CM | POA: Insufficient documentation

## 2015-11-14 DIAGNOSIS — H269 Unspecified cataract: Secondary | ICD-10-CM | POA: Diagnosis not present

## 2015-11-14 HISTORY — PX: CATARACT EXTRACTION W/PHACO: SHX586

## 2015-11-14 LAB — GLUCOSE, CAPILLARY: Glucose-Capillary: 139 mg/dL — ABNORMAL HIGH (ref 65–99)

## 2015-11-14 SURGERY — PHACOEMULSIFICATION, CATARACT, WITH IOL INSERTION
Anesthesia: Monitor Anesthesia Care | Site: Eye | Laterality: Right

## 2015-11-14 MED ORDER — BSS IO SOLN
INTRAOCULAR | Status: DC | PRN
  Administered 2015-11-14: 15 mL

## 2015-11-14 MED ORDER — LACTATED RINGERS IV SOLN
INTRAVENOUS | Status: DC
  Administered 2015-11-14: 07:00:00 via INTRAVENOUS

## 2015-11-14 MED ORDER — PHENYLEPHRINE HCL 2.5 % OP SOLN
1.0000 [drp] | OPHTHALMIC | Status: AC
  Administered 2015-11-14 (×3): 1 [drp] via OPHTHALMIC

## 2015-11-14 MED ORDER — MIDAZOLAM HCL 2 MG/2ML IJ SOLN
1.0000 mg | INTRAMUSCULAR | Status: DC | PRN
  Administered 2015-11-14 (×2): 1 mg via INTRAVENOUS

## 2015-11-14 MED ORDER — TETRACAINE HCL 0.5 % OP SOLN
1.0000 [drp] | OPHTHALMIC | Status: AC
  Administered 2015-11-14 (×3): 1 [drp] via OPHTHALMIC

## 2015-11-14 MED ORDER — PROVISC 10 MG/ML IO SOLN
INTRAOCULAR | Status: DC | PRN
  Administered 2015-11-14: 0.85 mL via INTRAOCULAR

## 2015-11-14 MED ORDER — LIDOCAINE HCL (PF) 1 % IJ SOLN
INTRAMUSCULAR | Status: AC
  Filled 2015-11-14: qty 2

## 2015-11-14 MED ORDER — FENTANYL CITRATE (PF) 100 MCG/2ML IJ SOLN
INTRAMUSCULAR | Status: AC
  Filled 2015-11-14: qty 2

## 2015-11-14 MED ORDER — MIDAZOLAM HCL 2 MG/2ML IJ SOLN
INTRAMUSCULAR | Status: AC
  Filled 2015-11-14: qty 2

## 2015-11-14 MED ORDER — FENTANYL CITRATE (PF) 100 MCG/2ML IJ SOLN
25.0000 ug | INTRAMUSCULAR | Status: DC | PRN
  Administered 2015-11-14: 25 ug via INTRAVENOUS

## 2015-11-14 MED ORDER — KETOROLAC TROMETHAMINE 0.5 % OP SOLN
1.0000 [drp] | OPHTHALMIC | Status: AC
  Administered 2015-11-14 (×3): 1 [drp] via OPHTHALMIC

## 2015-11-14 MED ORDER — EPINEPHRINE HCL 1 MG/ML IJ SOLN
INTRAOCULAR | Status: DC | PRN
  Administered 2015-11-14: 500 mL

## 2015-11-14 MED ORDER — CYCLOPENTOLATE-PHENYLEPHRINE 0.2-1 % OP SOLN
1.0000 [drp] | OPHTHALMIC | Status: AC
  Administered 2015-11-14 (×3): 1 [drp] via OPHTHALMIC

## 2015-11-14 MED ORDER — EPINEPHRINE HCL 1 MG/ML IJ SOLN
INTRAMUSCULAR | Status: AC
  Filled 2015-11-14: qty 1

## 2015-11-14 MED ORDER — TETRACAINE 0.5 % OP SOLN OPTIME - NO CHARGE
OPHTHALMIC | Status: DC | PRN
  Administered 2015-11-14: 2 [drp] via OPHTHALMIC

## 2015-11-14 SURGICAL SUPPLY — 11 items
CLOTH BEACON ORANGE TIMEOUT ST (SAFETY) ×3 IMPLANT
EYE SHIELD UNIVERSAL CLEAR (GAUZE/BANDAGES/DRESSINGS) ×3 IMPLANT
GLOVE BIO SURGEON STRL SZ 6.5 (GLOVE) ×2 IMPLANT
GLOVE BIO SURGEONS STRL SZ 6.5 (GLOVE) ×1
GLOVE EXAM NITRILE MD LF STRL (GLOVE) ×3 IMPLANT
LENS ALC ACRYL/TECN (Ophthalmic Related) ×3 IMPLANT
PAD ARMBOARD 7.5X6 YLW CONV (MISCELLANEOUS) ×3 IMPLANT
RING MALYGIN (MISCELLANEOUS) ×3 IMPLANT
TAPE SURG TRANSPORE 1 IN (GAUZE/BANDAGES/DRESSINGS) ×1 IMPLANT
TAPE SURGICAL TRANSPORE 1 IN (GAUZE/BANDAGES/DRESSINGS) ×2
WATER STERILE IRR 250ML POUR (IV SOLUTION) ×3 IMPLANT

## 2015-11-14 NOTE — Addendum Note (Signed)
Addendum  created 11/14/15 [id number] by Vista Deck, CRNA   Anesthesia Event edited

## 2015-11-14 NOTE — Anesthesia Procedure Notes (Signed)
Procedure Name: MAC Date/Time: 11/14/2015 7:27 AM Performed by: Vista Deck Pre-anesthesia Checklist: Patient identified, Emergency Drugs available, Suction available, Timeout performed and Patient being monitored Patient Re-evaluated:Patient Re-evaluated prior to inductionOxygen Delivery Method: Nasal Cannula

## 2015-11-14 NOTE — Discharge Instructions (Addendum)
General Anesthesia, Adult °General anesthesia is a sleep-like state of non-feeling produced by medicines (anesthetics). General anesthesia prevents you from being alert and feeling pain during a medical procedure. Your caregiver may recommend general anesthesia if your procedure: °· Is long. °· Is painful or uncomfortable. °· Would be frightening to see or hear. °· Requires you to be still. °· Affects your breathing. °· Causes significant blood loss. °LET YOUR CAREGIVER KNOW ABOUT: °· Allergies to food or medicine. °· Medicines taken, including vitamins, herbs, eyedrops, over-the-counter medicines, and creams. °· Use of steroids (by mouth or creams). °· Previous problems with anesthetics or numbing medicines, including problems experienced by relatives. °· History of bleeding problems or blood clots. °· Previous surgeries and types of anesthetics received. °· Possibility of pregnancy, if this applies. °· Use of cigarettes, alcohol, or illegal drugs. °· Any health condition(s), especially diabetes, sleep apnea, and high blood pressure. °RISKS AND COMPLICATIONS °General anesthesia rarely causes complications. However, if complications do occur, they can be life threatening. Complications include: °· A lung infection. °· A stroke. °· A heart attack. °· Waking up during the procedure. When this occurs, the patient may be unable to move and communicate that he or she is awake. The patient may feel severe pain. °Older adults and adults with serious medical problems are more likely to have complications than adults who are young and healthy. Some complications can be prevented by answering all of your caregiver's questions thoroughly and by following all pre-procedure instructions. It is important to tell your caregiver if any of the pre-procedure instructions, especially those related to diet, were not followed. Any food or liquid in the stomach can cause problems when you are under general anesthesia. °BEFORE THE  PROCEDURE °· Ask your caregiver if you will have to spend the night at the hospital. If you will not have to spend the night, arrange to have an adult drive you and stay with you for 24 hours. °· Follow your caregiver's instructions if you are taking dietary supplements or medicines. Your caregiver may tell you to stop taking them or to reduce your dosage. °· Do not smoke for as long as possible before your procedure. If possible, stop smoking 3-6 weeks before the procedure. °· Do not take new dietary supplements or medicines within 1 week of your procedure unless your caregiver approves them. °· Do not eat within 8 hours of your procedure or as directed by your caregiver. Drink only clear liquids, such as water, black coffee (without milk or cream), and fruit juices (without pulp). °· Do not drink within 3 hours of your procedure or as directed by your caregiver. °· You may brush your teeth on the morning of the procedure, but make sure to spit out the toothpaste and water when finished. °PROCEDURE  °You will receive anesthetics through a mask, through an intravenous (IV) access tube, or through both. A doctor who specializes in anesthesia (anesthesiologist) or a nurse who specializes in anesthesia (nurse anesthetist) or both will stay with you throughout the procedure to make sure you remain unconscious. He or she will also watch your blood pressure, pulse, and oxygen levels to make sure that the anesthetics do not cause any problems. Once you are asleep, a breathing tube or mask may be used to help you breathe. °AFTER THE PROCEDURE °You will wake up after the procedure is complete. You may be in the room where the procedure was performed or in a recovery area. You may have a sore throat   if a breathing tube was used. You may also feel:  Dizzy.  Weak.  Drowsy.  Confused.  Nauseous.  Cold. These are all normal responses and can be expected to last for up to 24 hours after the procedure is complete. A  caregiver will tell you when you are ready to go home. This will usually be when you are fully awake and in stable condition.   This information is not intended to replace advice given to you by your health care provider. Make sure you discuss any questions you have with your health care provider.   Document Released: 07/16/2007 Document Revised: 04/29/2014 Document Reviewed: 08/07/2011 Elsevier Interactive Patient Education 2016 [place] Instructions [place] [id number] [address]      1. Avoid closing eyes tightly. One often closes the eye tightly when laughing, talking, sneezing, coughing or if they feel irritated. At these times, you should be careful not to close your eyes tightly.  2. Instill eye drops as instructed. To instill drops in your eye, open it, look up and have someone gently pull the lower lid down and instill a couple of drops inside the lower lid.  3. Do not touch upper lid.  4. Take Advil or Tylenol for pain.  5. You may use either eye for near work, such as reading or sewing and you may watch television.  6. You may have your hair done at the beauty parlor at any time.  7. Wear dark glasses with or without your own glasses if you are in bright light.  8. Call our office at [phone] or [phone] if you have sharp pain in your eye or unusual symptoms.  9.  FOLLOW UP WITH DR. SHAPIRO TODAY IN HIS [place] OFFICE AT 2:45pm.    I have received a copy of the above instructions and will follow them.     IF YOU ARE IN IMMEDIATE DANGER CALL 911!  It is important for you to keep your follow-up appointment with your physician after discharge, OR, for you /your caregiver to make a follow-up appointment with your physician / medical provider after discharge.  Show these instructions to the next healthcare provider you see.   FOLLOW UP WITH DR Gershon Crane TODAY AT 2:45 PM AT HIS OFFICE

## 2015-11-14 NOTE — Op Note (Signed)
Patient brought to the operating room and prepped and draped in the usual manner.  Lid speculum inserted in right eye.  Stab incision made at the twelve o'clock position.  Provisc instilled in the anterior chamber.   A 2.4 mm. Stab incision was made temporally. Due to a small pupil, a Malugyn Ring was inserted.  An anterior capsulotomy was done with a bent 25 gauge needle.  The nucleus was hydrodissected.  The Phaco tip was inserted in the anterior chamber and the nucleus was emulsified.  CDE was 10.36.  The cortical material was then removed with the I and A tip.  Posterior capsule was the polished.  The anterior chamber was deepened with Provisc.  A 21.0 Diopter Alcon SN60WF IOL was then inserted in the capsular bag. The Malugyn Ring was removed.  Provisc was then removed with the I and A tip.  The wound was then hydrated.  Patient sent to the Recovery Room in good condition with follow up in my office.  Preoperative Diagnosis:  Nuclear Cataract OD Postoperative Diagnosis:  Same Procedure name: Kelman Phacoemulsification OD with IOL

## 2015-11-14 NOTE — Transfer of Care (Signed)
Immediate Anesthesia Transfer of Care Note  Patient: [name]  Procedure(s) Performed: Procedure(s) with comments: CATARACT EXTRACTION PHACO AND INTRAOCULAR LENS PLACEMENT (IOC) (Right) - CDE: 10.36  Patient Location: Short Stay  Anesthesia Type:MAC  Level of Consciousness: awake and alert   Airway & Oxygen Therapy: Patient Spontanous Breathing  Post-op Assessment: Report given to RN  Post vital signs: Reviewed and stable  Last Vitals:  Vitals:   11/14/15 0715 11/14/15 0720  BP:  117/68  Pulse:    Resp: 10 15  Temp:      Last Pain:  Vitals:   11/14/15 0643  TempSrc: Oral         Complications: No apparent anesthesia complications

## 2015-11-14 NOTE — Anesthesia Postprocedure Evaluation (Signed)
Anesthesia Post Note  Patient: [name]  Procedure(s) Performed: Procedure(s) (LRB): CATARACT EXTRACTION PHACO AND INTRAOCULAR LENS PLACEMENT (IOC) (Right)  Patient location during evaluation: Short Stay Anesthesia Type: MAC Level of consciousness: awake and alert Pain management: pain level controlled Vital Signs Assessment: post-procedure vital signs reviewed and stable Respiratory status: spontaneous breathing Cardiovascular status: stable Postop Assessment: no signs of nausea or vomiting Anesthetic complications: no    Last Vitals:  Vitals:   11/14/15 0715 11/14/15 0720  BP:  117/68  Pulse:    Resp: 10 15  Temp:      Last Pain:  Vitals:   11/14/15 0643  TempSrc: Oral                 [name]

## 2015-11-14 NOTE — H&P (Signed)
The patient was re examined and there is no change in the patients condition since the original H and P. 

## 2015-11-14 NOTE — Anesthesia Preprocedure Evaluation (Signed)
Anesthesia Evaluation  Patient identified by MRN, date of birth, ID band Patient awake    Reviewed: Allergy & Precautions, H&P , NPO status   Airway Mallampati: II  TM Distance: >3 FB     Dental  (+) Teeth Intact   Pulmonary former smoker,    breath sounds clear to auscultation       Cardiovascular hypertension,  Rhythm:Regular Rate:Normal     Neuro/Psych CVA, Residual Symptoms    GI/Hepatic GERD  ,  Endo/Other  diabetes, Type 2, Oral Hypoglycemic Agents  Renal/GU      Musculoskeletal   Abdominal   Peds  Hematology   Anesthesia Other Findings   Reproductive/Obstetrics                             Anesthesia Physical Anesthesia Plan  ASA: III  Anesthesia Plan: MAC   Post-op Pain Management:    Induction: Intravenous  Airway Management Planned: Nasal Cannula  Additional Equipment:   Intra-op Plan:   Post-operative Plan:   Informed Consent: I have reviewed the patients History and Physical, chart, labs and discussed the procedure including the risks, benefits and alternatives for the proposed anesthesia with the patient or authorized representative who has indicated his/her understanding and acceptance.     Plan Discussed with:   Anesthesia Plan Comments:         Anesthesia Quick Evaluation

## 2015-11-20 ENCOUNTER — Encounter (HOSPITAL_COMMUNITY): Payer: Self-pay | Admitting: Ophthalmology

## 2015-12-04 DIAGNOSIS — I10 Essential (primary) hypertension: Secondary | ICD-10-CM | POA: Diagnosis not present

## 2015-12-04 DIAGNOSIS — E1165 Type 2 diabetes mellitus with hyperglycemia: Secondary | ICD-10-CM | POA: Diagnosis not present

## 2015-12-05 DIAGNOSIS — H2512 Age-related nuclear cataract, left eye: Secondary | ICD-10-CM | POA: Diagnosis not present

## 2015-12-08 ENCOUNTER — Encounter (HOSPITAL_COMMUNITY)
Admission: RE | Admit: 2015-12-08 | Discharge: 2015-12-08 | Disposition: A | Discharge status: Home / Self Care | Payer: Medicare Other | Source: Ambulatory Visit | Attending: Ophthalmology | Admitting: Ophthalmology

## 2015-12-11 ENCOUNTER — Encounter (HOSPITAL_COMMUNITY): Payer: Self-pay | Admitting: Anesthesiology

## 2015-12-11 NOTE — Anesthesia Preprocedure Evaluation (Addendum)
Anesthesia Evaluation  Patient identified by MRN, date of birth, ID band Patient awake    Reviewed: Allergy & Precautions, H&P , NPO status , Patient's Chart, lab work & pertinent test results  Airway Mallampati: III  TM Distance: >3 FB     Dental  (+) Teeth Intact   Pulmonary former smoker,    Pulmonary exam normal breath sounds clear to auscultation       Cardiovascular hypertension, Pt. on medications  Rhythm:Regular Rate:Bradycardia  EKG 7/17- Sinus Bradycardia, 1st degree AV Block, minimal voltage criteria for LVH  Echo-08/01/2008 Wall thickness was increased increased in a pattern of mild to moderate LVH. There was concentric hypertrophy. The estimated ejection fraction was 75%. Wall motion was normal; there were no regional wall motion   Neuro/Psych Mild left sided weakness CVA, Residual Symptoms negative psych ROS   GI/Hepatic GERD  Medicated and Controlled,  Endo/Other  diabetes, Poorly Controlled, Type 2, Oral Hypoglycemic AgentsMorbid obesity  Renal/GU    Hx/o Prostate Ca Hx/o Bladder Ca    Musculoskeletal  (+) Arthritis ,   Abdominal Normal abdominal exam  (+) + obese,   Peds  Hematology On Plavix- last dose   Anesthesia Other Findings   Reproductive/Obstetrics                           Anesthesia Physical  Anesthesia Plan  ASA: III  Anesthesia Plan: MAC   Post-op Pain Management:    Induction: Intravenous  Airway Management Planned: Nasal Cannula and Natural Airway  Additional Equipment:   Intra-op Plan:   Post-operative Plan:   Informed Consent: I have reviewed the patients History and Physical, chart, labs and discussed the procedure including the risks, benefits and alternatives for the proposed anesthesia with the patient or authorized representative who has indicated his/her understanding and acceptance.     Plan Discussed with: Anesthesiologist,  CRNA and Surgeon  Anesthesia Plan Comments:         Anesthesia Quick Evaluation

## 2015-12-12 ENCOUNTER — Encounter (HOSPITAL_COMMUNITY)
Admission: RE | Disposition: A | Discharge status: Home / Self Care | Payer: Self-pay | Source: Ambulatory Visit | Attending: Ophthalmology

## 2015-12-12 ENCOUNTER — Ambulatory Visit (HOSPITAL_COMMUNITY): Payer: Medicare Other | Admitting: Anesthesiology

## 2015-12-12 ENCOUNTER — Ambulatory Visit (HOSPITAL_COMMUNITY)
Admission: RE | Admit: 2015-12-12 | Discharge: 2015-12-12 | Disposition: A | Discharge status: Home / Self Care | Payer: Medicare Other | Source: Ambulatory Visit | Attending: Ophthalmology | Admitting: Ophthalmology

## 2015-12-12 DIAGNOSIS — Z7984 Long term (current) use of oral hypoglycemic drugs: Secondary | ICD-10-CM | POA: Diagnosis not present

## 2015-12-12 DIAGNOSIS — Z6841 Body Mass Index (BMI) 40.0 and over, adult: Secondary | ICD-10-CM | POA: Diagnosis not present

## 2015-12-12 DIAGNOSIS — H2512 Age-related nuclear cataract, left eye: Secondary | ICD-10-CM | POA: Diagnosis not present

## 2015-12-12 DIAGNOSIS — K219 Gastro-esophageal reflux disease without esophagitis: Secondary | ICD-10-CM | POA: Diagnosis not present

## 2015-12-12 DIAGNOSIS — Z79899 Other long term (current) drug therapy: Secondary | ICD-10-CM | POA: Diagnosis not present

## 2015-12-12 DIAGNOSIS — E1165 Type 2 diabetes mellitus with hyperglycemia: Secondary | ICD-10-CM | POA: Diagnosis not present

## 2015-12-12 DIAGNOSIS — I10 Essential (primary) hypertension: Secondary | ICD-10-CM | POA: Insufficient documentation

## 2015-12-12 DIAGNOSIS — Z8551 Personal history of malignant neoplasm of bladder: Secondary | ICD-10-CM | POA: Diagnosis not present

## 2015-12-12 DIAGNOSIS — Z87891 Personal history of nicotine dependence: Secondary | ICD-10-CM | POA: Insufficient documentation

## 2015-12-12 DIAGNOSIS — I69354 Hemiplegia and hemiparesis following cerebral infarction affecting left non-dominant side: Secondary | ICD-10-CM | POA: Insufficient documentation

## 2015-12-12 DIAGNOSIS — H269 Unspecified cataract: Secondary | ICD-10-CM | POA: Diagnosis not present

## 2015-12-12 DIAGNOSIS — Z8546 Personal history of malignant neoplasm of prostate: Secondary | ICD-10-CM | POA: Diagnosis not present

## 2015-12-12 DIAGNOSIS — Z7902 Long term (current) use of antithrombotics/antiplatelets: Secondary | ICD-10-CM | POA: Diagnosis not present

## 2015-12-12 DIAGNOSIS — Z7982 Long term (current) use of aspirin: Secondary | ICD-10-CM | POA: Diagnosis not present

## 2015-12-12 HISTORY — PX: CATARACT EXTRACTION W/PHACO: SHX586

## 2015-12-12 LAB — GLUCOSE, CAPILLARY: Glucose-Capillary: 159 mg/dL — ABNORMAL HIGH (ref 65–99)

## 2015-12-12 SURGERY — PHACOEMULSIFICATION, CATARACT, WITH IOL INSERTION
Anesthesia: Monitor Anesthesia Care | Laterality: Left

## 2015-12-12 MED ORDER — EPINEPHRINE HCL 1 MG/ML IJ SOLN
INTRAMUSCULAR | Status: AC
  Filled 2015-12-12: qty 1

## 2015-12-12 MED ORDER — PROVISC 10 MG/ML IO SOLN
INTRAOCULAR | Status: DC | PRN
  Administered 2015-12-12: 0.85 mL via INTRAOCULAR

## 2015-12-12 MED ORDER — BSS IO SOLN
INTRAOCULAR | Status: DC | PRN
  Administered 2015-12-12: 15 mL

## 2015-12-12 MED ORDER — TETRACAINE HCL 0.5 % OP SOLN
1.0000 [drp] | OPHTHALMIC | Status: AC
  Administered 2015-12-12 (×3): 1 [drp] via OPHTHALMIC

## 2015-12-12 MED ORDER — KETOROLAC TROMETHAMINE 0.5 % OP SOLN
1.0000 [drp] | OPHTHALMIC | Status: AC
  Administered 2015-12-12 (×3): 1 [drp] via OPHTHALMIC

## 2015-12-12 MED ORDER — MIDAZOLAM HCL 2 MG/2ML IJ SOLN
INTRAMUSCULAR | Status: AC
  Filled 2015-12-12: qty 2

## 2015-12-12 MED ORDER — PHENYLEPHRINE HCL 2.5 % OP SOLN
1.0000 [drp] | OPHTHALMIC | Status: AC
  Administered 2015-12-12 (×3): 1 [drp] via OPHTHALMIC

## 2015-12-12 MED ORDER — CYCLOPENTOLATE-PHENYLEPHRINE 0.2-1 % OP SOLN
1.0000 [drp] | OPHTHALMIC | Status: AC
  Administered 2015-12-12 (×3): 1 [drp] via OPHTHALMIC

## 2015-12-12 MED ORDER — MIDAZOLAM HCL 2 MG/2ML IJ SOLN
INTRAMUSCULAR | Status: DC | PRN
  Administered 2015-12-12: 1 mg via INTRAVENOUS

## 2015-12-12 MED ORDER — EPINEPHRINE HCL 1 MG/ML IJ SOLN
INTRAOCULAR | Status: DC | PRN
  Administered 2015-12-12: 500 mL

## 2015-12-12 MED ORDER — TETRACAINE 0.5 % OP SOLN OPTIME - NO CHARGE
OPHTHALMIC | Status: DC | PRN
  Administered 2015-12-12: 2 [drp] via OPHTHALMIC

## 2015-12-12 MED ORDER — LACTATED RINGERS IV SOLN
INTRAVENOUS | Status: DC | PRN
  Administered 2015-12-12: 07:00:00 via INTRAVENOUS

## 2015-12-12 SURGICAL SUPPLY — 12 items
CLOTH BEACON ORANGE TIMEOUT ST (SAFETY) ×3 IMPLANT
EYE SHIELD UNIVERSAL CLEAR (GAUZE/BANDAGES/DRESSINGS) ×3 IMPLANT
GLOVE BIO SURGEON STRL SZ 6.5 (GLOVE) ×2 IMPLANT
GLOVE BIO SURGEONS STRL SZ 6.5 (GLOVE) ×1
GLOVE BIOGEL PI IND STRL 7.0 (GLOVE) ×1 IMPLANT
GLOVE BIOGEL PI INDICATOR 7.0 (GLOVE) ×2
GLOVE EXAM NITRILE MD LF STRL (GLOVE) ×3 IMPLANT
LENS ALC ACRYL/TECN (Ophthalmic Related) ×3 IMPLANT
PAD ARMBOARD 7.5X6 YLW CONV (MISCELLANEOUS) ×3 IMPLANT
TAPE SURG TRANSPORE 1 IN (GAUZE/BANDAGES/DRESSINGS) ×1 IMPLANT
TAPE SURGICAL TRANSPORE 1 IN (GAUZE/BANDAGES/DRESSINGS) ×2
WATER STERILE IRR 250ML POUR (IV SOLUTION) ×3 IMPLANT

## 2015-12-12 NOTE — Transfer of Care (Signed)
Immediate Anesthesia Transfer of Care Note  Patient: [name]  Procedure(s) Performed: Procedure(s): CATARACT EXTRACTION PHACO AND INTRAOCULAR LENS PLACEMENT LEFT EYE CDE=7.59 (Left)  Immediate Anesthesia Transfer of Care Note  Patient: [name]  Procedure(s) Performed: Procedure(s) (LRB): CATARACT EXTRACTION PHACO AND INTRAOCULAR LENS PLACEMENT LEFT EYE CDE=7.59 (Left)  Patient Location: Shortstay  Anesthesia Type: MAC  Level of Consciousness: awake  Airway & Oxygen Therapy: Patient Spontanous Breathing   Post-op Assessment: Report given to PACU RN, Post -op Vital signs reviewed and stable and Patient moving all extremities  Post vital signs: Reviewed and stable  Complications: No apparent anesthesia complications

## 2015-12-12 NOTE — Op Note (Signed)
Patient brought to the operating room and prepped and draped in the usual manner.  Lid speculum inserted in left eye.  Stab incision made at the twelve o'clock position.  Provisc instilled in the anterior chamber.   A 2.4 mm. Stab incision was made temporally.  An anterior capsulotomy was done with a bent 25 gauge needle.  The nucleus was hydrodissected.  The Phaco tip was inserted in the anterior chamber and the nucleus was emulsified.  CDE was 7.59.  The cortical material was then removed with the I and A tip.  Posterior capsule was the polished.  The anterior chamber was deepened with Provisc.  A 20.5 Diopter Alcon SN60WF IOL was then inserted in the capsular bag.  Provisc was then removed with the I and A tip.  The wound was then hydrated.  Patient sent to the Recovery Room in good condition with follow up in my office.  Preoperative Diagnosis:  Nuclear Cataract OS Postoperative Diagnosis:  Same Procedure name: Kelman Phacoemulsification OS with IOL

## 2015-12-12 NOTE — Discharge Instructions (Signed)
°  °          [facility] Instructions °[address]- [place] [address] °    ° °1. Avoid closing eyes tightly. One often closes the eye tightly when laughing, talking, sneezing, coughing or if they feel irritated. At these times, you should be careful not to close your eyes tightly. ° °2. Instill eye drops as instructed. To instill drops in your eye, open it, look up and have someone gently pull the lower lid down and instill a couple of drops inside the lower lid. ° °3. Do not touch upper lid. ° °4. Take Advil or Tylenol for pain. ° °5. You may use either eye for near work, such as reading or sewing and you may watch television. ° °6. You may have your hair done at the beauty parlor at any time. ° °7. Wear dark glasses with or without your own glasses if you are in bright light. ° °8. Call our office at [phone] or [phone] if you have sharp pain in your eye or unusual symptoms. ° °9.  FOLLOW UP WITH DR. SHAPIRO TODAY IN HIS [place] OFFICE AT 2:45pm. ° °  °I have received a copy of the above instructions and will follow them.  ° ° ° °IF YOU ARE IN IMMEDIATE DANGER CALL 911! ° °It is important for you to keep your follow-up appointment with your physician after discharge, OR, for you /your caregiver to make a follow-up appointment with your physician / medical provider after discharge. ° °Show these instructions to the next healthcare provider you see. ° °

## 2015-12-12 NOTE — H&P (Signed)
The patient was re examined and there is no change in the patients condition since the original H and P. 

## 2015-12-12 NOTE — Anesthesia Procedure Notes (Addendum)
Procedure Name: MAC Date/Time: 12/12/2015 7:20 AM Performed by: Vista Deck Pre-anesthesia Checklist: Patient identified, Emergency Drugs available, Suction available, Timeout performed and Patient being monitored Patient Re-evaluated:Patient Re-evaluated prior to inductionOxygen Delivery Method: Nasal Cannula

## 2015-12-12 NOTE — Anesthesia Postprocedure Evaluation (Signed)
Anesthesia Post Note  Patient: Nathaniel Man  Procedure(s) Performed: Procedure(s) (LRB): CATARACT EXTRACTION PHACO AND INTRAOCULAR LENS PLACEMENT LEFT EYE CDE=7.59 (Left)  Patient location during evaluation: PACU Anesthesia Type: MAC Level of consciousness: awake and alert and oriented Pain management: pain level controlled Vital Signs Assessment: post-procedure vital signs reviewed and stable Respiratory status: spontaneous breathing, nonlabored ventilation and respiratory function stable Cardiovascular status: stable and blood pressure returned to baseline Postop Assessment: no signs of nausea or vomiting Anesthetic complications: no    Last Vitals:  Vitals:   12/12/15 0715 12/12/15 0738  BP: 113/72 120/68  Pulse: (!) 49 (!) 48  Resp: 16 18  Temp:  36.5 C    Last Pain:  Vitals:   12/12/15 0738  TempSrc: Oral                 Kristi Norment A.

## 2015-12-12 NOTE — Anesthesia Postprocedure Evaluation (Signed)
Anesthesia Post Note  Patient: [name]  Procedure(s) Performed: Procedure(s) (LRB): CATARACT EXTRACTION PHACO AND INTRAOCULAR LENS PLACEMENT LEFT EYE CDE=7.59 (Left)  Anesthesia Post Evaluation  Last Vitals:  Vitals:   12/12/15 0655 12/12/15 0715  BP: 126/73 113/72  Pulse: (!) 49 (!) 49  Resp: 18 16  Temp: 36.7 C     Last Pain:  Vitals:   12/12/15 0655  TempSrc: Oral                 Kavitha Lansdale     Anesthesia Post-op Note  Patient: [name]  Procedure(s) Performed: Procedure(s) (LRB): CATARACT EXTRACTION PHACO AND INTRAOCULAR LENS PLACEMENT LEFT EYE CDE=7.59 (Left)  Patient Location:  Short Stay  Anesthesia Type: MAC  Level of Consciousness: awake  Airway and Oxygen Therapy: Patient Spontanous Breathing  Post-op Pain: none  Post-op Assessment: Post-op Vital signs reviewed, Patient's Cardiovascular Status Stable, Respiratory Function Stable, Patent Airway, No signs of Nausea or vomiting and Pain level controlled  Post-op Vital Signs: Reviewed and stable  Complications: No apparent anesthesia complications

## 2015-12-12 NOTE — Addendum Note (Signed)
Addendum  created 12/12/15 0758 by Josephine Igo, MD   Anesthesia Event edited

## 2015-12-15 ENCOUNTER — Encounter (HOSPITAL_COMMUNITY): Payer: Self-pay | Admitting: Ophthalmology

## 2016-04-23 DIAGNOSIS — Z961 Presence of intraocular lens: Secondary | ICD-10-CM | POA: Diagnosis not present

## 2016-05-13 DIAGNOSIS — E1165 Type 2 diabetes mellitus with hyperglycemia: Secondary | ICD-10-CM | POA: Diagnosis not present

## 2016-05-13 DIAGNOSIS — J209 Acute bronchitis, unspecified: Secondary | ICD-10-CM | POA: Diagnosis not present

## 2016-06-13 DIAGNOSIS — E1165 Type 2 diabetes mellitus with hyperglycemia: Secondary | ICD-10-CM | POA: Diagnosis not present

## 2016-06-13 DIAGNOSIS — I10 Essential (primary) hypertension: Secondary | ICD-10-CM | POA: Diagnosis not present

## 2016-07-08 DIAGNOSIS — I10 Essential (primary) hypertension: Secondary | ICD-10-CM | POA: Diagnosis not present

## 2016-07-08 DIAGNOSIS — G8190 Hemiplegia, unspecified affecting unspecified side: Secondary | ICD-10-CM | POA: Diagnosis not present

## 2016-07-08 DIAGNOSIS — I6350 Cerebral infarction due to unspecified occlusion or stenosis of unspecified cerebral artery: Secondary | ICD-10-CM | POA: Diagnosis not present

## 2016-07-08 DIAGNOSIS — E1165 Type 2 diabetes mellitus with hyperglycemia: Secondary | ICD-10-CM | POA: Diagnosis not present

## 2016-08-08 DIAGNOSIS — E1165 Type 2 diabetes mellitus with hyperglycemia: Secondary | ICD-10-CM | POA: Diagnosis not present

## 2016-08-08 DIAGNOSIS — I6350 Cerebral infarction due to unspecified occlusion or stenosis of unspecified cerebral artery: Secondary | ICD-10-CM | POA: Diagnosis not present

## 2016-08-29 DIAGNOSIS — H401131 Primary open-angle glaucoma, bilateral, mild stage: Secondary | ICD-10-CM | POA: Diagnosis not present

## 2016-09-07 DIAGNOSIS — E1165 Type 2 diabetes mellitus with hyperglycemia: Secondary | ICD-10-CM | POA: Diagnosis not present

## 2016-09-07 DIAGNOSIS — G8190 Hemiplegia, unspecified affecting unspecified side: Secondary | ICD-10-CM | POA: Diagnosis not present

## 2016-10-08 DIAGNOSIS — G8190 Hemiplegia, unspecified affecting unspecified side: Secondary | ICD-10-CM | POA: Diagnosis not present

## 2016-10-08 DIAGNOSIS — E1165 Type 2 diabetes mellitus with hyperglycemia: Secondary | ICD-10-CM | POA: Diagnosis not present

## 2016-11-07 DIAGNOSIS — G8190 Hemiplegia, unspecified affecting unspecified side: Secondary | ICD-10-CM | POA: Diagnosis not present

## 2016-11-07 DIAGNOSIS — E1165 Type 2 diabetes mellitus with hyperglycemia: Secondary | ICD-10-CM | POA: Diagnosis not present

## 2016-12-08 DIAGNOSIS — G8190 Hemiplegia, unspecified affecting unspecified side: Secondary | ICD-10-CM | POA: Diagnosis not present

## 2016-12-08 DIAGNOSIS — E1165 Type 2 diabetes mellitus with hyperglycemia: Secondary | ICD-10-CM | POA: Diagnosis not present

## 2016-12-10 DIAGNOSIS — H401131 Primary open-angle glaucoma, bilateral, mild stage: Secondary | ICD-10-CM | POA: Diagnosis not present

## 2017-01-06 DIAGNOSIS — I6350 Cerebral infarction due to unspecified occlusion or stenosis of unspecified cerebral artery: Secondary | ICD-10-CM | POA: Diagnosis not present

## 2017-01-06 DIAGNOSIS — G8190 Hemiplegia, unspecified affecting unspecified side: Secondary | ICD-10-CM | POA: Diagnosis not present

## 2017-01-06 DIAGNOSIS — I10 Essential (primary) hypertension: Secondary | ICD-10-CM | POA: Diagnosis not present

## 2017-01-31 DIAGNOSIS — Z23 Encounter for immunization: Secondary | ICD-10-CM | POA: Diagnosis not present

## 2017-03-03 DIAGNOSIS — C61 Malignant neoplasm of prostate: Secondary | ICD-10-CM | POA: Diagnosis not present

## 2017-03-03 DIAGNOSIS — G8190 Hemiplegia, unspecified affecting unspecified side: Secondary | ICD-10-CM | POA: Diagnosis not present

## 2017-03-03 DIAGNOSIS — I10 Essential (primary) hypertension: Secondary | ICD-10-CM | POA: Diagnosis not present

## 2017-03-18 DIAGNOSIS — H401131 Primary open-angle glaucoma, bilateral, mild stage: Secondary | ICD-10-CM | POA: Diagnosis not present

## 2017-04-03 DIAGNOSIS — J209 Acute bronchitis, unspecified: Secondary | ICD-10-CM | POA: Diagnosis not present

## 2017-05-04 DIAGNOSIS — E1165 Type 2 diabetes mellitus with hyperglycemia: Secondary | ICD-10-CM | POA: Diagnosis not present

## 2017-05-04 DIAGNOSIS — I10 Essential (primary) hypertension: Secondary | ICD-10-CM | POA: Diagnosis not present

## 2017-05-26 DIAGNOSIS — C61 Malignant neoplasm of prostate: Secondary | ICD-10-CM | POA: Diagnosis not present

## 2017-06-04 DIAGNOSIS — I10 Essential (primary) hypertension: Secondary | ICD-10-CM | POA: Diagnosis not present

## 2017-06-04 DIAGNOSIS — G8190 Hemiplegia, unspecified affecting unspecified side: Secondary | ICD-10-CM | POA: Diagnosis not present

## 2017-06-10 DIAGNOSIS — C678 Malignant neoplasm of overlapping sites of bladder: Secondary | ICD-10-CM | POA: Diagnosis not present

## 2017-06-10 DIAGNOSIS — C61 Malignant neoplasm of prostate: Secondary | ICD-10-CM | POA: Diagnosis not present

## 2017-06-24 DIAGNOSIS — H401131 Primary open-angle glaucoma, bilateral, mild stage: Secondary | ICD-10-CM | POA: Diagnosis not present

## 2017-06-24 DIAGNOSIS — H524 Presbyopia: Secondary | ICD-10-CM | POA: Diagnosis not present

## 2017-07-07 DIAGNOSIS — E1165 Type 2 diabetes mellitus with hyperglycemia: Secondary | ICD-10-CM | POA: Diagnosis not present

## 2017-07-07 DIAGNOSIS — Z0000 Encounter for general adult medical examination without abnormal findings: Secondary | ICD-10-CM | POA: Diagnosis not present

## 2017-07-07 DIAGNOSIS — I10 Essential (primary) hypertension: Secondary | ICD-10-CM | POA: Diagnosis not present

## 2017-07-07 DIAGNOSIS — Z1389 Encounter for screening for other disorder: Secondary | ICD-10-CM | POA: Diagnosis not present

## 2017-07-07 DIAGNOSIS — Z1331 Encounter for screening for depression: Secondary | ICD-10-CM | POA: Diagnosis not present

## 2017-07-07 DIAGNOSIS — R7309 Other abnormal glucose: Secondary | ICD-10-CM | POA: Diagnosis not present

## 2017-07-07 DIAGNOSIS — I6350 Cerebral infarction due to unspecified occlusion or stenosis of unspecified cerebral artery: Secondary | ICD-10-CM | POA: Diagnosis not present

## 2017-08-07 DIAGNOSIS — E1165 Type 2 diabetes mellitus with hyperglycemia: Secondary | ICD-10-CM | POA: Diagnosis not present

## 2017-08-07 DIAGNOSIS — I10 Essential (primary) hypertension: Secondary | ICD-10-CM | POA: Diagnosis not present

## 2017-09-06 DIAGNOSIS — I6350 Cerebral infarction due to unspecified occlusion or stenosis of unspecified cerebral artery: Secondary | ICD-10-CM | POA: Diagnosis not present

## 2017-09-06 DIAGNOSIS — E1165 Type 2 diabetes mellitus with hyperglycemia: Secondary | ICD-10-CM | POA: Diagnosis not present

## 2017-10-24 DIAGNOSIS — E1165 Type 2 diabetes mellitus with hyperglycemia: Secondary | ICD-10-CM | POA: Diagnosis not present

## 2017-10-24 DIAGNOSIS — I10 Essential (primary) hypertension: Secondary | ICD-10-CM | POA: Diagnosis not present

## 2017-10-27 ENCOUNTER — Encounter: Payer: Self-pay | Admitting: Internal Medicine

## 2017-11-24 DIAGNOSIS — I10 Essential (primary) hypertension: Secondary | ICD-10-CM | POA: Diagnosis not present

## 2017-11-24 DIAGNOSIS — E1165 Type 2 diabetes mellitus with hyperglycemia: Secondary | ICD-10-CM | POA: Diagnosis not present

## 2017-12-09 ENCOUNTER — Ambulatory Visit (INDEPENDENT_AMBULATORY_CARE_PROVIDER_SITE_OTHER): Payer: Self-pay

## 2017-12-09 DIAGNOSIS — Z86010 Personal history of colonic polyps: Secondary | ICD-10-CM

## 2017-12-09 MED ORDER — PEG 3350-KCL-NA BICARB-NACL 420 G PO SOLR: 4000.0000 mL | ORAL | 0 refills | Status: DC

## 2017-12-09 NOTE — Patient Instructions (Signed)
[name]   06/06/1940 MRN: [id number]    Procedure Date: 01/14/18 Time to register: 11:00am Place to register: [facility] Procedure Time: 12:00pm Scheduled provider: R. Garfield Cornea, MD  PREPARATION FOR COLONOSCOPY WITH TRI-LYTE SPLIT PREP  Please notify us immediately if you are diabetic, take iron supplements, or if you are on Coumadin or any other blood thinners.   Please hold the following medications: I will mail you a letter with this information.   You will need to purchase 1 fleet enema and 1 box of Bisacodyl 32m tablets.   2 DAYS BEFORE PROCEDURE:  DATE: 01/12/18   DAY: Monday Begin clear liquid diet AFTER your lunch meal. NO SOLID FOODS after this point.  1 DAY BEFORE PROCEDURE:  DATE: 01/13/18  DAY: Tuesday Continue clear liquids the entire day - NO SOLID FOOD.   Diabetic medications adjustments for today: see letter  At 2:00 pm:  Take 2 Bisacodyl tablets.   At 4:00pm:  Start drinking your solution. Make sure you mix well per instructions on the bottle. Try to drink 1 (one) 8 ounce glass every 10-15 minutes until you have consumed HALF the jug. You should complete by 6:00pm.You must keep the left over solution refrigerated until completed next day.  Continue clear liquids. You must drink plenty of clear liquids to prevent dehyration and kidney failure.     DAY OF PROCEDURE:   DATE: 01/14/18   DAY: Wednesday If you take medications for your heart, blood pressure or breathing, you may take these medications.  Diabetic medications adjustments for today: see letter  Five hours before your procedure time @ 7:00am:  Finish remaining amout of bowel prep, drinking 1 (one) 8 ounce glass every 10-15 minutes until complete. You have two hours to consume remaining prep.   Three hours before your procedure time _0 :00am:  Nothing by mouth.   At least one hour before going to the hospital:  Give yourself one Fleet enema. You may take your morning medications with  sip of water unless we have instructed otherwise.      Please see below for Dietary Information.  CLEAR LIQUIDS INCLUDE:  Water Jello (NOT red in color)   Ice Popsicles (NOT red in color)   Tea (sugar ok, no milk/cream) Powdered fruit flavored drinks  Coffee (sugar ok, no milk/cream) Gatorade/ Lemonade/ Kool-Aid  (NOT red in color)   Juice: apple, white grape, white cranberry Soft drinks  Clear bullion, consomme, broth (fat free beef/chicken/vegetable)  Carbonated beverages (any kind)  Strained chicken noodle soup Hard Candy   Remember: Clear liquids are liquids that will allow you to see your fingers on the other side of a clear glass. Be sure liquids are NOT red in color, and not cloudy, but CLEAR.  DO NOT EAT OR DRINK ANY OF THE FOLLOWING:  Dairy products of any kind   Cranberry juice Tomato juice / V8 juice   Grapefruit juice Orange juice     Red grape juice  Do not eat any solid foods, including such foods as: cereal, oatmeal, yogurt, fruits, vegetables, creamed soups, eggs, bread, crackers, pureed foods in a blender, etc.   HELPFUL HINTS FOR DRINKING PREP SOLUTION:   Make sure prep is extremely cold. Mix and refrigerate the the morning of the prep. You may also put in the freezer.   You may try mixing some Crystal Light or Country Time Lemonade if you prefer. Mix in small amounts; add more if necessary.  Try drinking through  a straw  Rinse mouth with water or a mouthwash between glasses, to remove after-taste.  Try sipping on a cold beverage /ice/ popsicles between glasses of prep.  Place a piece of sugar-free hard candy in mouth between glasses.  If you become nauseated, try consuming smaller amounts, or stretch out the time between glasses. Stop for 30-60 minutes, then slowly start back drinking.        OTHER INSTRUCTIONS  You will need a responsible adult at least 77 years of age to accompany you and drive you home. This person must remain in the waiting  room during your procedure. The hospital will cancel your procedure if you do not have a responsible adult with you.   1. Wear loose fitting clothing that is easily removed. 2. Leave jewelry and other valuables at home.  3. Remove all body piercing jewelry and leave at home. 4. Total time from sign-in until discharge is approximately 2-3 hours. 5. You should go home directly after your procedure and rest. You can resume normal activities the day after your procedure. 6. The day of your procedure you should not:  Drive  Make legal decisions  Operate machinery  Drink alcohol  Return to work   You may call the office (Dept: [phone]) before 5:00pm, or page the doctor on call [phone]) after 5:00pm, for further instructions, if necessary.   Insurance Information YOU WILL NEED TO CHECK WITH YOUR INSURANCE COMPANY FOR THE BENEFITS OF COVERAGE YOU HAVE FOR THIS PROCEDURE.  UNFORTUNATELY, NOT ALL INSURANCE COMPANIES HAVE BENEFITS TO COVER ALL OR PART OF THESE TYPES OF PROCEDURES.  IT IS YOUR RESPONSIBILITY TO CHECK YOUR BENEFITS, HOWEVER, WE WILL BE GLAD TO ASSIST YOU WITH ANY CODES YOUR INSURANCE COMPANY MAY NEED.    PLEASE NOTE THAT MOST INSURANCE COMPANIES WILL NOT COVER A SCREENING COLONOSCOPY FOR PEOPLE UNDER THE AGE OF 50  IF YOU HAVE BCBS INSURANCE, YOU MAY HAVE BENEFITS FOR A SCREENING COLONOSCOPY BUT IF POLYPS ARE FOUND THE DIAGNOSIS WILL CHANGE AND THEN YOU MAY HAVE A DEDUCTIBLE THAT WILL NEED TO BE MET. SO PLEASE MAKE SURE YOU CHECK YOUR BENEFITS FOR A SCREENING COLONOSCOPY AS WELL AS A DIAGNOSTIC COLONOSCOPY.

## 2017-12-09 NOTE — Progress Notes (Signed)
Gastroenterology Pre-Procedure Review  Request Date:12/09/17 Requesting Physician: 5 year recall ( last tcs 11/20/12 RMR- tubular adenoma)  PATIENT REVIEW QUESTIONS: The patient responded to the following health history questions as indicated:    Spoke with pt regarding current guidelines of stopping tcs screenings at age 77, pt stated he understood but would like to have one more screening tcs at this time.   1. Diabetes Melitis: yes (metformin 500mg  bid) 2. Joint replacements in the past 12 months: no 3. Major health problems in the past 3 months: no 4. Has an artificial valve or MVP: no 5. Has a defibrillator: no 6. Has been advised in past to take antibiotics in advance of a procedure like teeth cleaning: no 7. Family history of colon cancer: no  8. Alcohol Use: no 9. History of sleep apnea: no  10. History of coronary artery or other vascular stents placed within the last 12 months: no 11. History of any prior anesthesia complications: no    MEDICATIONS & ALLERGIES:    Patient reports the following regarding taking any blood thinners:   Plavix? yes (75mg ) Aspirin? yes (325mg ) Coumadin? no Brilinta? no Xarelto? no Eliquis? no Pradaxa? no Savaysa? no Effient? no  Patient confirms/reports the following medications:  Current Outpatient Medications  Medication Sig Dispense Refill  . amLODipine (NORVASC) 10 MG tablet Take 10 mg by mouth daily.     Marland Kitchen aspirin 325 MG tablet Take 325 mg by mouth daily.    . cloNIDine (CATAPRES) 0.2 MG tablet Take 0.2 mg by mouth 2 (two) times daily.     . clopidogrel (PLAVIX) 75 MG tablet Take 75 mg by mouth daily.    . furosemide (LASIX) 20 MG tablet Take 20 mg by mouth 3 (three) times daily.     . hydrALAZINE (APRESOLINE) 50 MG tablet Take 50 mg by mouth 3 (three) times daily.     Marland Kitchen ibuprofen (ADVIL,MOTRIN) 200 MG tablet Take 200 mg by mouth every 6 (six) hours as needed.    Marland Kitchen lisinopril (PRINIVIL,ZESTRIL) 40 MG tablet Take 40 mg by mouth daily.      . metFORMIN (GLUCOPHAGE) 500 MG tablet Take 500 mg by mouth 2 (two) times daily.  4  . omeprazole (PRILOSEC) 20 MG capsule Take 20 mg by mouth daily.     Marland Kitchen spironolactone (ALDACTONE) 25 MG tablet daily.  3  . traMADol (ULTRAM) 50 MG tablet Take 50 mg by mouth 2 (two) times daily.      No current facility-administered medications for this visit.     Patient confirms/reports the following allergies:  No Known Allergies  No orders of the defined types were placed in this encounter.   AUTHORIZATION INFORMATION Primary Insurance: [employer] medicare ,  ID #: [id number] Pre-Cert / Josem Kaufmann required: no   SCHEDULE INFORMATION: Procedure has been scheduled as follows:  Date: 9:25/19, Time: 12:00 Location: [facility] Dr[facility]  This Gastroenterology Pre-Precedure Review Form is being routed to the following provider(s): Walden Field NP

## 2017-12-11 NOTE — Progress Notes (Signed)
May need augmented sedation due to Ultram. Last TCS 2014 on conscious sedation but patient not on bid Ultram at that time.

## 2017-12-11 NOTE — Progress Notes (Signed)
[name], please schedule him an ov. I will mail him a letter.

## 2017-12-12 NOTE — Progress Notes (Signed)
PATIENT SCHEDULED  °

## 2017-12-16 NOTE — Progress Notes (Signed)
Letter mailed to the pt with date and time of appointment.

## 2017-12-16 NOTE — Progress Notes (Signed)
Called [place], cancelled procedure orders and took pt off the schedule.

## 2017-12-25 DIAGNOSIS — Z961 Presence of intraocular lens: Secondary | ICD-10-CM | POA: Diagnosis not present

## 2017-12-25 DIAGNOSIS — H401131 Primary open-angle glaucoma, bilateral, mild stage: Secondary | ICD-10-CM | POA: Diagnosis not present

## 2017-12-25 DIAGNOSIS — E119 Type 2 diabetes mellitus without complications: Secondary | ICD-10-CM | POA: Diagnosis not present

## 2018-01-05 DIAGNOSIS — G8190 Hemiplegia, unspecified affecting unspecified side: Secondary | ICD-10-CM | POA: Diagnosis not present

## 2018-01-05 DIAGNOSIS — E1165 Type 2 diabetes mellitus with hyperglycemia: Secondary | ICD-10-CM | POA: Diagnosis not present

## 2018-01-05 DIAGNOSIS — I10 Essential (primary) hypertension: Secondary | ICD-10-CM | POA: Diagnosis not present

## 2018-01-05 DIAGNOSIS — Z23 Encounter for immunization: Secondary | ICD-10-CM | POA: Diagnosis not present

## 2018-01-06 ENCOUNTER — Encounter: Payer: Self-pay | Admitting: Gastroenterology

## 2018-01-06 ENCOUNTER — Telehealth: Payer: Self-pay

## 2018-01-06 ENCOUNTER — Ambulatory Visit: Payer: Medicare Other | Admitting: Gastroenterology

## 2018-01-06 ENCOUNTER — Other Ambulatory Visit: Payer: Self-pay

## 2018-01-06 VITALS — BP 146/78 | HR 85 | Temp 98.2°F | Ht 67.0 in | Wt 278.4 lb

## 2018-01-06 DIAGNOSIS — Z86010 Personal history of colonic polyps: Secondary | ICD-10-CM | POA: Diagnosis not present

## 2018-01-06 NOTE — H&P (View-Only) (Signed)
Primary Care Physician:  Rosita Fire, MD Primary Gastroenterologist:  Dr. Gala Romney   Chief Complaint  Patient presents with  . Consult    TCS    HPI:   [name] is a 77 y.o. male presenting today to arrange colonoscopy with history of adenomas, last colonoscopy in 2014.   He has no rectal bleeding, changes in bowel habits, constipation, diarrhea, abdominal pain, N/V, dysphagia. No concerning upper GI symptoms. On Tramadol chronically. Brought in today to discuss Propofol.       Past Medical History:  Diagnosis Date  . Arthritis   . Bladder cancer ([place])   . Diabetes ([place])   . GERD (gastroesophageal reflux disease)   . Hypertension   . Prostate cancer ([place])   . Stroke ([place]) 3/09   slight left sided weakness    Past Surgical History:  Procedure Laterality Date  . BACK SURGERY  1996   ruptured disc  . CATARACT EXTRACTION W/PHACO Right 11/14/2015   Procedure: CATARACT EXTRACTION PHACO AND INTRAOCULAR LENS PLACEMENT (IOC);  Surgeon: Rutherford Guys, MD;  Location: [facility];  Service: Ophthalmology;  Laterality: Right;  CDE: 10.36  . CATARACT EXTRACTION W/PHACO Left 12/12/2015   Procedure: CATARACT EXTRACTION PHACO AND INTRAOCULAR LENS PLACEMENT LEFT EYE CDE=7.59;  Surgeon: Rutherford Guys, MD;  Location: [facility];  Service: Ophthalmology;  Laterality: Left;  . COLONOSCOPY  12/23/2007   RMR: Normal rectum.  Pedunculated polyp, distal transverse colon, status post hot snare removal.  Remainder of colonic mucosa and terminal mucosa appeared normal. path with multiple fragments of tubular adenoma  . COLONOSCOPY  12/08/2002   SFK:CLEXN internal hemorrhoids, otherwise, normal colonoscopy  . COLONOSCOPY  2001   per Dr. Olevia Perches note in 2004, large tubulovillous adenoma  . COLONOSCOPY N/A 11/20/2012   TZG:[id number] diverticulosis. Colonic polyps-removed as described above. I suspect benign anorectal bleeding.  tubular adenomas. next TCS 11/2017.  Marland Kitchen HEMORRHOID BANDING  05/13/2013  .  PROSTATE SURGERY  2000    Current Outpatient Medications  Medication Sig Dispense Refill  . amLODipine (NORVASC) 10 MG tablet Take 10 mg by mouth daily.     Marland Kitchen aspirin 325 MG tablet Take 325 mg by mouth daily.    . cloNIDine (CATAPRES) 0.2 MG tablet Take 0.2 mg by mouth 2 (two) times daily.     . clopidogrel (PLAVIX) 75 MG tablet Take 75 mg by mouth daily.    . furosemide (LASIX) 20 MG tablet Take 20 mg by mouth 3 (three) times daily.     . hydrALAZINE (APRESOLINE) 50 MG tablet Take 50 mg by mouth 3 (three) times daily.     Marland Kitchen ibuprofen (ADVIL,MOTRIN) 200 MG tablet Take 200 mg by mouth every 6 (six) hours as needed.    Marland Kitchen lisinopril (PRINIVIL,ZESTRIL) 40 MG tablet Take 40 mg by mouth daily.     . metFORMIN (GLUCOPHAGE) 500 MG tablet Take 500 mg by mouth 2 (two) times daily.  4  . omeprazole (PRILOSEC) 20 MG capsule Take 20 mg by mouth daily.     . polyethylene glycol-electrolytes (TRILYTE) 420 g solution Take 4,000 mLs by mouth as directed. 4000 mL 0  . spironolactone (ALDACTONE) 25 MG tablet daily.  3  . traMADol (ULTRAM) 50 MG tablet Take 50 mg by mouth 2 (two) times daily.      No current facility-administered medications for this visit.     Allergies as of 01/06/2018  . (No Known Allergies)    Family History  Problem Relation Age of  Onset  . Colon cancer Neg Hx     Social History   Socioeconomic History  . Marital status: Married    Spouse name: Not on file  . Number of children: Not on file  . Years of education: Not on file  . Highest education level: Not on file  Occupational History  . Not on file  Social Needs  . Financial resource strain: Not on file  . Food insecurity:    Worry: Not on file    Inability: Not on file  . Transportation needs:    Medical: Not on file    Non-medical: Not on file  Tobacco Use  . Smoking status: Former Smoker    Packs/day: 0.50    Years: 30.00    Pack years: 15.00    Types: Cigarettes    Last attempt to quit: 11/08/2007     Years since quitting: 10.1  . Smokeless tobacco: Never Used  . Tobacco comment: quit in 2009  Substance and Sexual Activity  . Alcohol use: Yes    Comment: rare  . Drug use: No  . Sexual activity: Never    Birth control/protection: None  Lifestyle  . Physical activity:    Days per week: Not on file    Minutes per session: Not on file  . Stress: Not on file  Relationships  . Social connections:    Talks on phone: Not on file    Gets together: Not on file    Attends religious service: Not on file    Active member of club or organization: Not on file    Attends meetings of clubs or organizations: Not on file    Relationship status: Not on file  . Intimate partner violence:    Fear of current or ex partner: Not on file    Emotionally abused: Not on file    Physically abused: Not on file    Forced sexual activity: Not on file  Other Topics Concern  . Not on file  Social History Narrative  . Not on file    Review of Systems: Gen: Denies any fever, chills, fatigue, weight loss, lack of appetite.  CV: Denies chest pain, heart palpitations, peripheral edema, syncope.  Resp: Denies shortness of breath at rest or with exertion. Denies wheezing or cough.  GI: see HPI  GU : Denies urinary burning, urinary frequency, urinary hesitancy MS: Denies joint pain, muscle weakness, cramps, or limitation of movement.  Derm: Denies rash, itching, dry skin Psych: Denies depression, anxiety, memory loss, and confusion Heme: Denies bruising, bleeding, and enlarged lymph nodes.  Physical Exam: BP (!) 146/78   Pulse 85   Temp 98.2 F (36.8 C) (Oral)   Ht 5\' 7"  (1.702 m)   Wt 278 lb 6.4 oz (126.3 kg)   BMI 43.60 kg/m  General:   Alert and oriented. Pleasant and cooperative. Well-nourished and well-developed.  Head:  Normocephalic and atraumatic. Eyes:  Without icterus, sclera clear and conjunctiva pink.  Ears:  Normal auditory acuity. Nose:  No deformity, discharge,  or lesions. Mouth:   No deformity or lesions, oral mucosa pink.  Lungs:  Clear to auscultation bilaterally. No wheezes, rales, or rhonchi. No distress.  Heart:  S1, S2 present with soft systolic murmur noted  Abdomen:  +BS, round AP diameter but soft, no obvious mass or HSM Rectal:  Deferred  Msk:  Symmetrical without gross deformities. Normal posture. Extremities:  1+ ankle/pedal edema Neurologic:  Alert and  oriented x4;  grossly normal  neurologically. Psych:  Alert and cooperative. Normal mood and affect.

## 2018-01-06 NOTE — Telephone Encounter (Signed)
Tried to call pt to inform of pre-op appt 01/27/18 at 1:15pm, no answer, LMOAM. Letter mailed.

## 2018-01-06 NOTE — Progress Notes (Signed)
Primary Care Physician:  Rosita Fire, MD Primary Gastroenterologist:  Dr. Gala Romney   Chief Complaint  Patient presents with  . Consult    TCS    HPI:   [name] is a 77 y.o. male presenting today to arrange colonoscopy with history of adenomas, last colonoscopy in 2014.   He has no rectal bleeding, changes in bowel habits, constipation, diarrhea, abdominal pain, N/V, dysphagia. No concerning upper GI symptoms. On Tramadol chronically. Brought in today to discuss Propofol.       Past Medical History:  Diagnosis Date  . Arthritis   . Bladder cancer ([place])   . Diabetes ([place])   . GERD (gastroesophageal reflux disease)   . Hypertension   . Prostate cancer ([place])   . Stroke ([place]) 3/09   slight left sided weakness    Past Surgical History:  Procedure Laterality Date  . BACK SURGERY  1996   ruptured disc  . CATARACT EXTRACTION W/PHACO Right 11/14/2015   Procedure: CATARACT EXTRACTION PHACO AND INTRAOCULAR LENS PLACEMENT (IOC);  Surgeon: Rutherford Guys, MD;  Location: [facility];  Service: Ophthalmology;  Laterality: Right;  CDE: 10.36  . CATARACT EXTRACTION W/PHACO Left 12/12/2015   Procedure: CATARACT EXTRACTION PHACO AND INTRAOCULAR LENS PLACEMENT LEFT EYE CDE=7.59;  Surgeon: Rutherford Guys, MD;  Location: [facility];  Service: Ophthalmology;  Laterality: Left;  . COLONOSCOPY  12/23/2007   RMR: Normal rectum.  Pedunculated polyp, distal transverse colon, status post hot snare removal.  Remainder of colonic mucosa and terminal mucosa appeared normal. path with multiple fragments of tubular adenoma  . COLONOSCOPY  12/08/2002   IOE:[id number] internal hemorrhoids, otherwise, normal colonoscopy  . COLONOSCOPY  2001   per Dr. Olevia Perches note in 2004, large tubulovillous adenoma  . COLONOSCOPY N/A 11/20/2012   [id number]:[id number] diverticulosis. Colonic polyps-removed as described above. I suspect benign anorectal bleeding.  tubular adenomas. next TCS 11/2017.  Marland Kitchen HEMORRHOID BANDING  05/13/2013  .  PROSTATE SURGERY  2000    Current Outpatient Medications  Medication Sig Dispense Refill  . amLODipine (NORVASC) 10 MG tablet Take 10 mg by mouth daily.     Marland Kitchen aspirin 325 MG tablet Take 325 mg by mouth daily.    . cloNIDine (CATAPRES) 0.2 MG tablet Take 0.2 mg by mouth 2 (two) times daily.     . clopidogrel (PLAVIX) 75 MG tablet Take 75 mg by mouth daily.    . furosemide (LASIX) 20 MG tablet Take 20 mg by mouth 3 (three) times daily.     . hydrALAZINE (APRESOLINE) 50 MG tablet Take 50 mg by mouth 3 (three) times daily.     Marland Kitchen ibuprofen (ADVIL,MOTRIN) 200 MG tablet Take 200 mg by mouth every 6 (six) hours as needed.    Marland Kitchen lisinopril (PRINIVIL,ZESTRIL) 40 MG tablet Take 40 mg by mouth daily.     . metFORMIN (GLUCOPHAGE) 500 MG tablet Take 500 mg by mouth 2 (two) times daily.  4  . omeprazole (PRILOSEC) 20 MG capsule Take 20 mg by mouth daily.     . polyethylene glycol-electrolytes (TRILYTE) 420 g solution Take 4,000 mLs by mouth as directed. 4000 mL 0  . spironolactone (ALDACTONE) 25 MG tablet daily.  3  . traMADol (ULTRAM) 50 MG tablet Take 50 mg by mouth 2 (two) times daily.      No current facility-administered medications for this visit.     Allergies as of 01/06/2018  . (No Known Allergies)    Family History  Problem Relation Age of  Onset  . Colon cancer Neg Hx     Social History   Socioeconomic History  . Marital status: Married    Spouse name: Not on file  . Number of children: Not on file  . Years of education: Not on file  . Highest education level: Not on file  Occupational History  . Not on file  Social Needs  . Financial resource strain: Not on file  . Food insecurity:    Worry: Not on file    Inability: Not on file  . Transportation needs:    Medical: Not on file    Non-medical: Not on file  Tobacco Use  . Smoking status: Former Smoker    Packs/day: 0.50    Years: 30.00    Pack years: 15.00    Types: Cigarettes    Last attempt to quit: 11/08/2007     Years since quitting: 10.1  . Smokeless tobacco: Never Used  . Tobacco comment: quit in 2009  Substance and Sexual Activity  . Alcohol use: Yes    Comment: rare  . Drug use: No  . Sexual activity: Never    Birth control/protection: None  Lifestyle  . Physical activity:    Days per week: Not on file    Minutes per session: Not on file  . Stress: Not on file  Relationships  . Social connections:    Talks on phone: Not on file    Gets together: Not on file    Attends religious service: Not on file    Active member of club or organization: Not on file    Attends meetings of clubs or organizations: Not on file    Relationship status: Not on file  . Intimate partner violence:    Fear of current or ex partner: Not on file    Emotionally abused: Not on file    Physically abused: Not on file    Forced sexual activity: Not on file  Other Topics Concern  . Not on file  Social History Narrative  . Not on file    Review of Systems: Gen: Denies any fever, chills, fatigue, weight loss, lack of appetite.  CV: Denies chest pain, heart palpitations, peripheral edema, syncope.  Resp: Denies shortness of breath at rest or with exertion. Denies wheezing or cough.  GI: see HPI  GU : Denies urinary burning, urinary frequency, urinary hesitancy MS: Denies joint pain, muscle weakness, cramps, or limitation of movement.  Derm: Denies rash, itching, dry skin Psych: Denies depression, anxiety, memory loss, and confusion Heme: Denies bruising, bleeding, and enlarged lymph nodes.  Physical Exam: BP (!) 146/78   Pulse 85   Temp 98.2 F (36.8 C) (Oral)   Ht 5\' 7"  (1.702 m)   Wt 278 lb 6.4 oz (126.3 kg)   BMI 43.60 kg/m  General:   Alert and oriented. Pleasant and cooperative. Well-nourished and well-developed.  Head:  Normocephalic and atraumatic. Eyes:  Without icterus, sclera clear and conjunctiva pink.  Ears:  Normal auditory acuity. Nose:  No deformity, discharge,  or lesions. Mouth:   No deformity or lesions, oral mucosa pink.  Lungs:  Clear to auscultation bilaterally. No wheezes, rales, or rhonchi. No distress.  Heart:  S1, S2 present with soft systolic murmur noted  Abdomen:  +BS, round AP diameter but soft, no obvious mass or HSM Rectal:  Deferred  Msk:  Symmetrical without gross deformities. Normal posture. Extremities:  1+ ankle/pedal edema Neurologic:  Alert and  oriented x4;  grossly normal  neurologically. Psych:  Alert and cooperative. Normal mood and affect.

## 2018-01-06 NOTE — Assessment & Plan Note (Addendum)
77 year old pleasant male with need for surveillance colonoscopy, without any concerning lower or upper GI symptoms. Due to polypharmacy, will arrange with Propofol.  Proceed with TCS with Dr. Gala Romney in near future with Propofol: the risks, benefits, and alternatives have been discussed with the patient in detail. The patient states understanding and desires to proceed. As of note, he is on both plavix and aspirin.  No metformin day of procedure

## 2018-01-06 NOTE — Patient Instructions (Signed)
We have scheduled a colonoscopy with Dr. Gala Romney in the near future.  Do not take metformin the morning of the procedure.   It was good to see you again!  It was a pleasure to see you today. I strive to create trusting relationships with patients to provide genuine, compassionate, and quality care. I value your feedback. If you receive a survey regarding your visit,  I greatly appreciate you taking time to fill this out.   Annitta Needs, PhD, ANP-BC [facility]

## 2018-01-07 NOTE — Progress Notes (Signed)
CC'D TO PCP °

## 2018-01-14 ENCOUNTER — Ambulatory Visit (HOSPITAL_COMMUNITY): Admit: 2018-01-14 | Payer: Medicare Other | Admitting: Internal Medicine

## 2018-01-14 ENCOUNTER — Encounter (HOSPITAL_COMMUNITY): Payer: Self-pay

## 2018-01-14 SURGERY — COLONOSCOPY
Anesthesia: Moderate Sedation

## 2018-01-22 NOTE — Patient Instructions (Signed)
   Your procedure is scheduled on: 02/02/2018  Report to [facility] at  9:15   AM.  Call this number if you have problems the morning of surgery: [phone]   Remember:              Follow Directions on the letter you received from Your Physician's office regarding the Bowel Prep  :  Take these medicines the morning of surgery with A SIP OF WATER: Amlodipine, Catapres, Apresoline, Lisinopril, Prilosec and Tramadol if needed   Do not wear jewelry, make-up or nail polish.    Do not bring valuables to the hospital.  Contacts, dentures or bridgework may not be worn into surgery.  .   Patients discharged the day of surgery will not be allowed to drive home.     Colonoscopy, Adult, Care After This sheet gives you information about how to care for yourself after your procedure. Your health care provider may also give you more specific instructions. If you have problems or questions, contact your health care provider. What can I expect after the procedure? After the procedure, it is common to have:  A small amount of blood in your stool for 24 hours after the procedure.  Some gas.  Mild abdominal cramping or bloating.  Follow these instructions at home: General instructions   For the first 24 hours after the procedure: ? Do not drive or use machinery. ? Do not sign important documents. ? Do not drink alcohol. ? Do your regular daily activities at a slower pace than normal. ? Eat soft, easy-to-digest foods. ? Rest often.  Take over-the-counter or prescription medicines only as told by your health care provider.  It is up to you to get the results of your procedure. Ask your health care provider, or the department performing the procedure, when your results will be ready. Relieving cramping and bloating  Try walking around when you have cramps or feel bloated.  Apply heat to your abdomen as told by your health care provider. Use a heat source that your health care provider  recommends, such as a moist heat pack or a heating pad. ? Place a towel between your skin and the heat source. ? Leave the heat on for 20-30 minutes. ? Remove the heat if your skin turns bright red. This is especially important if you are unable to feel pain, heat, or cold. You may have a greater risk of getting burned. Eating and drinking  Drink enough fluid to keep your urine clear or pale yellow.  Resume your normal diet as instructed by your health care provider. Avoid heavy or fried foods that are hard to digest.  Avoid drinking alcohol for as long as instructed by your health care provider. Contact a health care provider if:  You have blood in your stool 2-3 days after the procedure. Get help right away if:  You have more than a small spotting of blood in your stool.  You pass large blood clots in your stool.  Your abdomen is swollen.  You have nausea or vomiting.  You have a fever.  You have increasing abdominal pain that is not relieved with medicine. This information is not intended to replace advice given to you by your health care provider. Make sure you discuss any questions you have with your health care provider. Document Released: 11/21/2003 Document Revised: 01/01/2016 Document Reviewed: 06/20/2015 Elsevier Interactive Patient Education  [employer].

## 2018-01-27 ENCOUNTER — Encounter (HOSPITAL_COMMUNITY)
Admission: RE | Admit: 2018-01-27 | Discharge: 2018-01-27 | Disposition: A | Discharge status: Home / Self Care | Payer: Medicare Other | Source: Ambulatory Visit | Attending: Internal Medicine | Admitting: Internal Medicine

## 2018-01-27 ENCOUNTER — Encounter (HOSPITAL_COMMUNITY): Payer: Self-pay

## 2018-01-27 ENCOUNTER — Other Ambulatory Visit: Payer: Self-pay

## 2018-01-27 DIAGNOSIS — Z01818 Encounter for other preprocedural examination: Secondary | ICD-10-CM | POA: Diagnosis not present

## 2018-01-27 HISTORY — DX: Unspecified glaucoma: H40.9

## 2018-01-27 LAB — CBC WITH DIFFERENTIAL/PLATELET
Abs Immature Granulocytes: 0.04 10*3/uL (ref 0.00–0.07)
BASOS ABS: 0.1 10*3/uL (ref 0.0–0.1)
BASOS PCT: 1 %
EOS ABS: 0.2 10*3/uL (ref 0.0–0.5)
Eosinophils Relative: 2 %
HEMATOCRIT: 39.7 % (ref 39.0–52.0)
Hemoglobin: 12.8 g/dL — ABNORMAL LOW (ref 13.0–17.0)
IMMATURE GRANULOCYTES: 0 %
LYMPHS ABS: 2.6 10*3/uL (ref 0.7–4.0)
Lymphocytes Relative: 27 %
MCH: 30 pg (ref 26.0–34.0)
MCHC: 32.2 g/dL (ref 30.0–36.0)
MCV: 93 fL (ref 80.0–100.0)
Monocytes Absolute: 0.7 10*3/uL (ref 0.1–1.0)
Monocytes Relative: 7 %
Neutro Abs: 6.2 10*3/uL (ref 1.7–7.7)
Neutrophils Relative %: 63 %
PLATELETS: 245 10*3/uL (ref 150–400)
RBC: 4.27 MIL/uL (ref 4.22–5.81)
RDW: 13.7 % (ref 11.5–15.5)
WBC: 9.6 10*3/uL (ref 4.0–10.5)
nRBC: 0 % (ref 0.0–0.2)

## 2018-01-27 LAB — BASIC METABOLIC PANEL
ANION GAP: 10 (ref 5–15)
BUN: 17 mg/dL (ref 8–23)
CALCIUM: 8.6 mg/dL — AB (ref 8.9–10.3)
CO2: 23 mmol/L (ref 22–32)
Chloride: 104 mmol/L (ref 98–111)
Creatinine, Ser: 1.08 mg/dL (ref 0.61–1.24)
GFR calc non Af Amer: >60 mL/min (ref >60)
Glucose, Bld: 127 mg/dL — ABNORMAL HIGH (ref 70–99)
Potassium: 4 mmol/L (ref 3.5–5.1)
SODIUM: 137 mmol/L (ref 135–145)

## 2018-02-02 ENCOUNTER — Ambulatory Visit (HOSPITAL_COMMUNITY): Payer: Medicare Other | Admitting: Anesthesiology

## 2018-02-02 ENCOUNTER — Encounter (HOSPITAL_COMMUNITY)
Admission: RE | Disposition: A | Discharge status: Home / Self Care | Payer: Self-pay | Source: Ambulatory Visit | Attending: Internal Medicine

## 2018-02-02 ENCOUNTER — Encounter (HOSPITAL_COMMUNITY): Payer: Self-pay | Admitting: *Deleted

## 2018-02-02 ENCOUNTER — Other Ambulatory Visit: Payer: Self-pay

## 2018-02-02 ENCOUNTER — Ambulatory Visit (HOSPITAL_COMMUNITY)
Admission: RE | Admit: 2018-02-02 | Discharge: 2018-02-02 | Disposition: A | Discharge status: Home / Self Care | Payer: Medicare Other | Source: Ambulatory Visit | Attending: Internal Medicine | Admitting: Internal Medicine

## 2018-02-02 DIAGNOSIS — Z8551 Personal history of malignant neoplasm of bladder: Secondary | ICD-10-CM | POA: Diagnosis not present

## 2018-02-02 DIAGNOSIS — E119 Type 2 diabetes mellitus without complications: Secondary | ICD-10-CM | POA: Insufficient documentation

## 2018-02-02 DIAGNOSIS — K648 Other hemorrhoids: Secondary | ICD-10-CM | POA: Insufficient documentation

## 2018-02-02 DIAGNOSIS — K635 Polyp of colon: Secondary | ICD-10-CM

## 2018-02-02 DIAGNOSIS — Z7984 Long term (current) use of oral hypoglycemic drugs: Secondary | ICD-10-CM | POA: Insufficient documentation

## 2018-02-02 DIAGNOSIS — Z1211 Encounter for screening for malignant neoplasm of colon: Secondary | ICD-10-CM | POA: Diagnosis not present

## 2018-02-02 DIAGNOSIS — Z79899 Other long term (current) drug therapy: Secondary | ICD-10-CM | POA: Diagnosis not present

## 2018-02-02 DIAGNOSIS — D125 Benign neoplasm of sigmoid colon: Secondary | ICD-10-CM | POA: Diagnosis not present

## 2018-02-02 DIAGNOSIS — Z87891 Personal history of nicotine dependence: Secondary | ICD-10-CM | POA: Diagnosis not present

## 2018-02-02 DIAGNOSIS — I10 Essential (primary) hypertension: Secondary | ICD-10-CM | POA: Diagnosis not present

## 2018-02-02 DIAGNOSIS — Z7982 Long term (current) use of aspirin: Secondary | ICD-10-CM | POA: Diagnosis not present

## 2018-02-02 DIAGNOSIS — Z7902 Long term (current) use of antithrombotics/antiplatelets: Secondary | ICD-10-CM | POA: Diagnosis not present

## 2018-02-02 DIAGNOSIS — Z86010 Personal history of colonic polyps: Secondary | ICD-10-CM | POA: Diagnosis not present

## 2018-02-02 DIAGNOSIS — K219 Gastro-esophageal reflux disease without esophagitis: Secondary | ICD-10-CM | POA: Diagnosis not present

## 2018-02-02 DIAGNOSIS — Z8546 Personal history of malignant neoplasm of prostate: Secondary | ICD-10-CM | POA: Insufficient documentation

## 2018-02-02 DIAGNOSIS — I69354 Hemiplegia and hemiparesis following cerebral infarction affecting left non-dominant side: Secondary | ICD-10-CM | POA: Diagnosis not present

## 2018-02-02 HISTORY — PX: COLONOSCOPY WITH PROPOFOL: SHX5780

## 2018-02-02 HISTORY — PX: POLYPECTOMY: SHX5525

## 2018-02-02 LAB — GLUCOSE, CAPILLARY
GLUCOSE-CAPILLARY: 138 mg/dL — AB (ref 70–99)
Glucose-Capillary: 148 mg/dL — ABNORMAL HIGH (ref 70–99)

## 2018-02-02 SURGERY — COLONOSCOPY WITH PROPOFOL
Anesthesia: Monitor Anesthesia Care

## 2018-02-02 MED ORDER — PROPOFOL 10 MG/ML IV BOLUS
INTRAVENOUS | Status: DC | PRN
  Administered 2018-02-02: 30 mg via INTRAVENOUS

## 2018-02-02 MED ORDER — PROPOFOL 500 MG/50ML IV EMUL
INTRAVENOUS | Status: DC | PRN
  Administered 2018-02-02: 150 ug/kg/min via INTRAVENOUS

## 2018-02-02 MED ORDER — LACTATED RINGERS IV SOLN
INTRAVENOUS | Status: DC
  Administered 2018-02-02: 11:00:00 via INTRAVENOUS

## 2018-02-02 NOTE — Transfer of Care (Signed)
Immediate Anesthesia Transfer of Care Note  Patient: [name]  Procedure(s) Performed: COLONOSCOPY WITH PROPOFOL (N/A ) POLYPECTOMY  Patient Location: PACU  Anesthesia Type:MAC  Level of Consciousness: awake, alert , oriented and patient cooperative  Airway & Oxygen Therapy: Patient Spontanous Breathing  Post-op Assessment: Report given to RN and Post -op Vital signs reviewed and stable  Post vital signs: Reviewed and stable  Last Vitals:  Vitals Value Taken Time  BP    Temp    Pulse 52 02/02/2018 11:02 AM  Resp    SpO2 96 % 02/02/2018 11:02 AM  Vitals shown include unvalidated device data.  Last Pain:  Vitals:   02/02/18 1035  TempSrc:   PainSc: 0-No pain      Patients Stated Pain Goal: 8 (17/35/67 0141)  Complications: No apparent anesthesia complications

## 2018-02-02 NOTE — Anesthesia Postprocedure Evaluation (Signed)
Anesthesia Post Note  Patient: [name]  Procedure(s) Performed: COLONOSCOPY WITH PROPOFOL (N/A ) POLYPECTOMY  Patient location during evaluation: PACU Anesthesia Type: MAC Level of consciousness: awake and alert and oriented Pain management: pain level controlled Vital Signs Assessment: post-procedure vital signs reviewed and stable Respiratory status: spontaneous breathing Cardiovascular status: stable Postop Assessment: no apparent nausea or vomiting Anesthetic complications: no     Last Vitals:  Vitals:   02/02/18 1019  BP: 136/73  Pulse: (!) 55  Resp: (!) 22  Temp: 36.4 C  SpO2: 97%    Last Pain:  Vitals:   02/02/18 1035  TempSrc:   PainSc: 0-No pain                 ADAMS, AMY A

## 2018-02-02 NOTE — Discharge Instructions (Signed)

## 2018-02-02 NOTE — Op Note (Signed)
[facility] Patient Name: [name] Procedure Date: 02/02/2018 10:32 AM MRN: [id number] Date of Birth: 06-02-1940 Attending MD: Norvel Richards , MD CSN: [id number] Age: 77 Admit Type: Outpatient Procedure:                Colonoscopy Indications:              High risk colon cancer surveillance: Personal                            history of colonic polyps Providers:                Norvel Richards, MD, Otis Peak B. Sharon Seller, RN,                            Nelma Rothman, Technician Referring MD:             Rosita Fire MD, MD Medicines:                Propofol per Anesthesia Complications:            No immediate complications. Estimated Blood Loss:     Estimated blood loss was minimal. Procedure:                Pre-Anesthesia Assessment:                           - Prior to the procedure, a History and Physical                            was performed, and patient medications and                            allergies were reviewed. The patient's tolerance of                            previous anesthesia was also reviewed. The risks                            and benefits of the procedure and the sedation                            options and risks were discussed with the patient.                            All questions were answered, and informed consent                            was obtained. Prior Anticoagulants: The patient has                            taken no previous anticoagulant or antiplatelet                            agents. ASA Grade Assessment: II - A patient with  mild systemic disease. After reviewing the risks                            and benefits, the patient was deemed in                            satisfactory condition to undergo the procedure.                           After obtaining informed consent, the colonoscope                            was passed under direct vision. Throughout the    procedure, the patient's blood pressure, pulse, and                            oxygen saturations were monitored continuously. The                            CF-[id number] ([id number]) scope was introduced through                            the and advanced to the the cecum, identified by                            appendiceal orifice and ileocecal valve. The                            colonoscopy was performed without difficulty. The                            patient tolerated the procedure well. The quality                            of the bowel preparation was adequate. Scope In: 10:41:02 AM Scope Out: 10:54:22 AM Scope Withdrawal Time: 0 hours 8 minutes 11 seconds  Total Procedure Duration: 0 hours 13 minutes 20 seconds  Findings:      The perianal and digital rectal examinations were normal.      Non-bleeding internal hemorrhoids were found during retroflexion.      A 5 mm polyp was found in the sigmoid colon. The polyp was sessile. The       polyp was removed with a cold snare. Resection and retrieval were       complete. Estimated blood loss was minimal.      The exam was otherwise without abnormality on direct and retroflexion       views. Impression:               - Non-bleeding internal hemorrhoids.                           - One 5 mm polyp in the sigmoid colon, removed with                            a cold snare. Resected and retrieved.                           -  The examination was otherwise normal on direct                            and retroflexion views. Moderate Sedation:      Moderate (conscious) sedation was personally administered by an       anesthesia professional. The following parameters were monitored: oxygen       saturation, heart rate, blood pressure, respiratory rate, EKG, adequacy       of pulmonary ventilation, and response to care. Recommendation:           - Patient has a contact number available for                            emergencies. The signs and  symptoms of potential                            delayed complications were discussed with the                            patient. Return to normal activities tomorrow.                            Written discharge instructions were provided to the                            patient.                           - Resume previous diet.                           - Continue present medications.                           - Repeat colonoscopy date to be determined after                            pending pathology results are reviewed for                            surveillance based on pathology results.                           - Return to GI office (date not yet determined). Procedure Code(s):        --- Professional ---                           [phone], Colonoscopy, flexible; with removal of                            tumor(s), polyp(s), or other lesion(s) by snare                            technique Diagnosis Code(s):        --- Professional ---  Z86.010, Personal history of colonic polyps                           D12.5, Benign neoplasm of sigmoid colon                           K64.8, Other hemorrhoids CPT copyright 2018 American Medical Association. All rights reserved. The codes documented in this report are preliminary and upon coder review may  be revised to meet current compliance requirements. Cristopher Estimable. Wyndi Northrup, MD Norvel Richards, MD 02/02/2018 11:01:02 AM This report has been signed electronically. Number of Addenda: 0

## 2018-02-02 NOTE — Interval H&P Note (Signed)
History and Physical Interval Note:  02/02/2018 10:26 AM  [name]  has presented today for surgery, with the diagnosis of colon polyps  The various methods of treatment have been discussed with the patient and family. After consideration of risks, benefits and other options for treatment, the patient has consented to  Procedure(s) with comments: COLONOSCOPY WITH PROPOFOL (N/A) - 10:45am as a surgical intervention .  The patient's history has been reviewed, patient examined, no change in status, stable for surgery.  I have reviewed the patient's chart and labs.  Questions were answered to the patient's satisfaction.     Jaycub Noorani  No change.  Remains on Plavix.  Surveillance colonoscopy per plan.  The risks, benefits, limitations, alternatives and imponderables have been reviewed with the patient. Questions have been answered. All parties are agreeable.

## 2018-02-02 NOTE — Anesthesia Preprocedure Evaluation (Signed)
Anesthesia Evaluation  Patient identified by MRN, date of birth, ID band Patient awake    Reviewed: Allergy & Precautions, H&P , NPO status , Patient's Chart, lab work & pertinent test results, reviewed documented beta blocker date and time   Airway Mallampati: II  TM Distance: >3 FB Neck ROM: full    Dental no notable dental hx.    Pulmonary neg pulmonary ROS, former smoker,    Pulmonary exam normal breath sounds clear to auscultation       Cardiovascular Exercise Tolerance: Good hypertension, negative cardio ROS   Rhythm:regular Rate:Normal     Neuro/Psych CVA negative psych ROS   GI/Hepatic Neg liver ROS, GERD  ,  Endo/Other  negative endocrine ROSdiabetes  Renal/GU negative Renal ROS  negative genitourinary   Musculoskeletal   Abdominal   Peds  Hematology negative hematology ROS (+)   Anesthesia Other Findings   Reproductive/Obstetrics negative OB ROS                             Anesthesia Physical Anesthesia Plan  ASA: III  Anesthesia Plan: MAC   Post-op Pain Management:    Induction:   PONV Risk Score and Plan:   Airway Management Planned:   Additional Equipment:   Intra-op Plan:   Post-operative Plan:   Informed Consent: I have reviewed the patients History and Physical, chart, labs and discussed the procedure including the risks, benefits and alternatives for the proposed anesthesia with the patient or authorized representative who has indicated his/her understanding and acceptance.     Plan Discussed with: CRNA  Anesthesia Plan Comments:         Anesthesia Quick Evaluation

## 2018-02-04 ENCOUNTER — Encounter: Payer: Self-pay | Admitting: Internal Medicine

## 2018-02-06 ENCOUNTER — Encounter: Payer: Self-pay | Admitting: Internal Medicine

## 2018-02-09 ENCOUNTER — Encounter (HOSPITAL_COMMUNITY): Payer: Self-pay | Admitting: Internal Medicine

## 2018-03-07 DIAGNOSIS — I10 Essential (primary) hypertension: Secondary | ICD-10-CM | POA: Diagnosis not present

## 2018-03-07 DIAGNOSIS — E1165 Type 2 diabetes mellitus with hyperglycemia: Secondary | ICD-10-CM | POA: Diagnosis not present

## 2018-03-23 ENCOUNTER — Ambulatory Visit: Payer: Medicare Other | Admitting: Nurse Practitioner

## 2018-04-02 DIAGNOSIS — I10 Essential (primary) hypertension: Secondary | ICD-10-CM | POA: Diagnosis not present

## 2018-04-02 DIAGNOSIS — E1165 Type 2 diabetes mellitus with hyperglycemia: Secondary | ICD-10-CM | POA: Diagnosis not present

## 2018-05-07 DIAGNOSIS — I10 Essential (primary) hypertension: Secondary | ICD-10-CM | POA: Diagnosis not present

## 2018-05-07 DIAGNOSIS — E1165 Type 2 diabetes mellitus with hyperglycemia: Secondary | ICD-10-CM | POA: Diagnosis not present

## 2018-06-07 DIAGNOSIS — E1165 Type 2 diabetes mellitus with hyperglycemia: Secondary | ICD-10-CM | POA: Diagnosis not present

## 2018-06-07 DIAGNOSIS — I10 Essential (primary) hypertension: Secondary | ICD-10-CM | POA: Diagnosis not present

## 2018-06-22 DIAGNOSIS — C678 Malignant neoplasm of overlapping sites of bladder: Secondary | ICD-10-CM | POA: Diagnosis not present

## 2018-06-25 DIAGNOSIS — H401131 Primary open-angle glaucoma, bilateral, mild stage: Secondary | ICD-10-CM | POA: Diagnosis not present

## 2018-07-06 DIAGNOSIS — Z1389 Encounter for screening for other disorder: Secondary | ICD-10-CM | POA: Diagnosis not present

## 2018-07-06 DIAGNOSIS — Z0001 Encounter for general adult medical examination with abnormal findings: Secondary | ICD-10-CM | POA: Diagnosis not present

## 2018-07-06 DIAGNOSIS — G8190 Hemiplegia, unspecified affecting unspecified side: Secondary | ICD-10-CM | POA: Diagnosis not present

## 2018-07-06 DIAGNOSIS — I10 Essential (primary) hypertension: Secondary | ICD-10-CM | POA: Diagnosis not present

## 2018-07-06 DIAGNOSIS — R7309 Other abnormal glucose: Secondary | ICD-10-CM | POA: Diagnosis not present

## 2018-07-06 DIAGNOSIS — I6350 Cerebral infarction due to unspecified occlusion or stenosis of unspecified cerebral artery: Secondary | ICD-10-CM | POA: Diagnosis not present

## 2018-07-06 DIAGNOSIS — E1165 Type 2 diabetes mellitus with hyperglycemia: Secondary | ICD-10-CM | POA: Diagnosis not present

## 2018-07-09 DIAGNOSIS — E1165 Type 2 diabetes mellitus with hyperglycemia: Secondary | ICD-10-CM | POA: Diagnosis not present

## 2018-08-09 DIAGNOSIS — I6350 Cerebral infarction due to unspecified occlusion or stenosis of unspecified cerebral artery: Secondary | ICD-10-CM | POA: Diagnosis not present

## 2018-08-09 DIAGNOSIS — E1165 Type 2 diabetes mellitus with hyperglycemia: Secondary | ICD-10-CM | POA: Diagnosis not present

## 2018-09-11 DIAGNOSIS — E1165 Type 2 diabetes mellitus with hyperglycemia: Secondary | ICD-10-CM | POA: Diagnosis not present

## 2018-09-11 DIAGNOSIS — I10 Essential (primary) hypertension: Secondary | ICD-10-CM | POA: Diagnosis not present

## 2018-10-12 DIAGNOSIS — E1165 Type 2 diabetes mellitus with hyperglycemia: Secondary | ICD-10-CM | POA: Diagnosis not present

## 2018-10-12 DIAGNOSIS — I6350 Cerebral infarction due to unspecified occlusion or stenosis of unspecified cerebral artery: Secondary | ICD-10-CM | POA: Diagnosis not present

## 2018-10-12 DIAGNOSIS — G8190 Hemiplegia, unspecified affecting unspecified side: Secondary | ICD-10-CM | POA: Diagnosis not present

## 2018-10-22 ENCOUNTER — Other Ambulatory Visit: Payer: Medicare Other

## 2018-10-22 ENCOUNTER — Other Ambulatory Visit: Payer: Self-pay

## 2018-10-22 DIAGNOSIS — R6889 Other general symptoms and signs: Secondary | ICD-10-CM | POA: Diagnosis not present

## 2018-10-22 DIAGNOSIS — Z20822 Contact with and (suspected) exposure to covid-19: Secondary | ICD-10-CM

## 2018-10-26 DIAGNOSIS — H401131 Primary open-angle glaucoma, bilateral, mild stage: Secondary | ICD-10-CM | POA: Diagnosis not present

## 2018-10-28 LAB — NOVEL CORONAVIRUS, NAA: SARS-CoV-2, NAA: NOT DETECTED

## 2018-11-11 DIAGNOSIS — E1165 Type 2 diabetes mellitus with hyperglycemia: Secondary | ICD-10-CM | POA: Diagnosis not present

## 2018-11-11 DIAGNOSIS — I10 Essential (primary) hypertension: Secondary | ICD-10-CM | POA: Diagnosis not present

## 2018-12-10 DIAGNOSIS — I10 Essential (primary) hypertension: Secondary | ICD-10-CM | POA: Diagnosis not present

## 2018-12-10 DIAGNOSIS — I6350 Cerebral infarction due to unspecified occlusion or stenosis of unspecified cerebral artery: Secondary | ICD-10-CM | POA: Diagnosis not present

## 2018-12-12 DIAGNOSIS — R51 Headache: Secondary | ICD-10-CM | POA: Diagnosis not present

## 2019-01-07 DIAGNOSIS — G8190 Hemiplegia, unspecified affecting unspecified side: Secondary | ICD-10-CM | POA: Diagnosis not present

## 2019-01-07 DIAGNOSIS — E1165 Type 2 diabetes mellitus with hyperglycemia: Secondary | ICD-10-CM | POA: Diagnosis not present

## 2019-01-07 DIAGNOSIS — Z23 Encounter for immunization: Secondary | ICD-10-CM | POA: Diagnosis not present

## 2019-01-07 DIAGNOSIS — I10 Essential (primary) hypertension: Secondary | ICD-10-CM | POA: Diagnosis not present

## 2019-01-08 DIAGNOSIS — I10 Essential (primary) hypertension: Secondary | ICD-10-CM | POA: Diagnosis not present

## 2019-01-08 DIAGNOSIS — E1165 Type 2 diabetes mellitus with hyperglycemia: Secondary | ICD-10-CM | POA: Diagnosis not present

## 2019-02-04 DIAGNOSIS — E1165 Type 2 diabetes mellitus with hyperglycemia: Secondary | ICD-10-CM | POA: Diagnosis not present

## 2019-02-04 DIAGNOSIS — I6350 Cerebral infarction due to unspecified occlusion or stenosis of unspecified cerebral artery: Secondary | ICD-10-CM | POA: Diagnosis not present

## 2019-02-05 ENCOUNTER — Other Ambulatory Visit: Payer: Self-pay

## 2019-02-05 NOTE — Patient Outreach (Signed)
[facility]) Care Management  02/05/2019  NEPHI KINZEL [birth year] [id number]   Medication Adherence call to Mr. Karder Swasey HIPPA Compliant Voice message left with a call back number. Mr. Minzey is showing past due on Metfromin 500 mg and Tradjenta 5 mg under [place].   [place] Management Direct Dial [phone]  Fax [phone] [email or web address]

## 2019-03-01 DIAGNOSIS — E119 Type 2 diabetes mellitus without complications: Secondary | ICD-10-CM | POA: Diagnosis not present

## 2019-03-01 DIAGNOSIS — H401131 Primary open-angle glaucoma, bilateral, mild stage: Secondary | ICD-10-CM | POA: Diagnosis not present

## 2019-03-01 DIAGNOSIS — Z961 Presence of intraocular lens: Secondary | ICD-10-CM | POA: Diagnosis not present

## 2019-03-07 DIAGNOSIS — I10 Essential (primary) hypertension: Secondary | ICD-10-CM | POA: Diagnosis not present

## 2019-03-07 DIAGNOSIS — E1165 Type 2 diabetes mellitus with hyperglycemia: Secondary | ICD-10-CM | POA: Diagnosis not present

## 2019-04-06 DIAGNOSIS — I10 Essential (primary) hypertension: Secondary | ICD-10-CM | POA: Diagnosis not present

## 2019-04-06 DIAGNOSIS — E1165 Type 2 diabetes mellitus with hyperglycemia: Secondary | ICD-10-CM | POA: Diagnosis not present

## 2019-05-07 DIAGNOSIS — I10 Essential (primary) hypertension: Secondary | ICD-10-CM | POA: Diagnosis not present

## 2019-05-07 DIAGNOSIS — E1165 Type 2 diabetes mellitus with hyperglycemia: Secondary | ICD-10-CM | POA: Diagnosis not present

## 2019-05-11 ENCOUNTER — Other Ambulatory Visit: Payer: Self-pay

## 2019-05-11 ENCOUNTER — Ambulatory Visit: Payer: Medicare Other | Attending: Internal Medicine

## 2019-05-11 DIAGNOSIS — Z20822 Contact with and (suspected) exposure to covid-19: Secondary | ICD-10-CM

## 2019-05-12 ENCOUNTER — Telehealth: Payer: Self-pay

## 2019-05-12 LAB — NOVEL CORONAVIRUS, NAA: SARS-CoV-2, NAA: NOT DETECTED

## 2019-05-12 NOTE — Telephone Encounter (Signed)
Pt notified of negative COVID-19 results. Understanding verbalized.  [name]   

## 2019-06-07 DIAGNOSIS — E1165 Type 2 diabetes mellitus with hyperglycemia: Secondary | ICD-10-CM | POA: Diagnosis not present

## 2019-06-07 DIAGNOSIS — I10 Essential (primary) hypertension: Secondary | ICD-10-CM | POA: Diagnosis not present

## 2019-06-29 DIAGNOSIS — G8190 Hemiplegia, unspecified affecting unspecified side: Secondary | ICD-10-CM | POA: Diagnosis not present

## 2019-06-29 DIAGNOSIS — E1165 Type 2 diabetes mellitus with hyperglycemia: Secondary | ICD-10-CM | POA: Diagnosis not present

## 2019-06-29 DIAGNOSIS — Z0001 Encounter for general adult medical examination with abnormal findings: Secondary | ICD-10-CM | POA: Diagnosis not present

## 2019-06-29 DIAGNOSIS — Z1389 Encounter for screening for other disorder: Secondary | ICD-10-CM | POA: Diagnosis not present

## 2019-06-30 DIAGNOSIS — Z8551 Personal history of malignant neoplasm of bladder: Secondary | ICD-10-CM | POA: Diagnosis not present

## 2019-07-02 DIAGNOSIS — Z0001 Encounter for general adult medical examination with abnormal findings: Secondary | ICD-10-CM | POA: Diagnosis not present

## 2019-07-02 DIAGNOSIS — I10 Essential (primary) hypertension: Secondary | ICD-10-CM | POA: Diagnosis not present

## 2019-07-02 DIAGNOSIS — E1165 Type 2 diabetes mellitus with hyperglycemia: Secondary | ICD-10-CM | POA: Diagnosis not present

## 2019-07-30 DIAGNOSIS — E1165 Type 2 diabetes mellitus with hyperglycemia: Secondary | ICD-10-CM | POA: Diagnosis not present

## 2019-07-30 DIAGNOSIS — I10 Essential (primary) hypertension: Secondary | ICD-10-CM | POA: Diagnosis not present

## 2019-08-29 DIAGNOSIS — I10 Essential (primary) hypertension: Secondary | ICD-10-CM | POA: Diagnosis not present

## 2019-08-29 DIAGNOSIS — E1165 Type 2 diabetes mellitus with hyperglycemia: Secondary | ICD-10-CM | POA: Diagnosis not present

## 2019-09-29 DIAGNOSIS — E1165 Type 2 diabetes mellitus with hyperglycemia: Secondary | ICD-10-CM | POA: Diagnosis not present

## 2019-09-29 DIAGNOSIS — I10 Essential (primary) hypertension: Secondary | ICD-10-CM | POA: Diagnosis not present

## 2019-10-29 DIAGNOSIS — I10 Essential (primary) hypertension: Secondary | ICD-10-CM | POA: Diagnosis not present

## 2019-10-29 DIAGNOSIS — E1165 Type 2 diabetes mellitus with hyperglycemia: Secondary | ICD-10-CM | POA: Diagnosis not present

## 2019-11-29 DIAGNOSIS — G8190 Hemiplegia, unspecified affecting unspecified side: Secondary | ICD-10-CM | POA: Diagnosis not present

## 2019-11-29 DIAGNOSIS — E1165 Type 2 diabetes mellitus with hyperglycemia: Secondary | ICD-10-CM | POA: Diagnosis not present

## 2019-12-28 DIAGNOSIS — I10 Essential (primary) hypertension: Secondary | ICD-10-CM | POA: Diagnosis not present

## 2019-12-28 DIAGNOSIS — G8190 Hemiplegia, unspecified affecting unspecified side: Secondary | ICD-10-CM | POA: Diagnosis not present

## 2019-12-28 DIAGNOSIS — E1165 Type 2 diabetes mellitus with hyperglycemia: Secondary | ICD-10-CM | POA: Diagnosis not present

## 2020-01-10 DIAGNOSIS — Z23 Encounter for immunization: Secondary | ICD-10-CM | POA: Diagnosis not present

## 2020-01-27 DIAGNOSIS — I10 Essential (primary) hypertension: Secondary | ICD-10-CM | POA: Diagnosis not present

## 2020-01-27 DIAGNOSIS — E1165 Type 2 diabetes mellitus with hyperglycemia: Secondary | ICD-10-CM | POA: Diagnosis not present

## 2020-02-27 DIAGNOSIS — I10 Essential (primary) hypertension: Secondary | ICD-10-CM | POA: Diagnosis not present

## 2020-02-27 DIAGNOSIS — E1165 Type 2 diabetes mellitus with hyperglycemia: Secondary | ICD-10-CM | POA: Diagnosis not present

## 2020-03-28 DIAGNOSIS — E1165 Type 2 diabetes mellitus with hyperglycemia: Secondary | ICD-10-CM | POA: Diagnosis not present

## 2020-03-28 DIAGNOSIS — I10 Essential (primary) hypertension: Secondary | ICD-10-CM | POA: Diagnosis not present

## 2020-04-28 DIAGNOSIS — E1165 Type 2 diabetes mellitus with hyperglycemia: Secondary | ICD-10-CM | POA: Diagnosis not present

## 2020-04-28 DIAGNOSIS — I10 Essential (primary) hypertension: Secondary | ICD-10-CM | POA: Diagnosis not present

## 2020-05-02 DIAGNOSIS — H401131 Primary open-angle glaucoma, bilateral, mild stage: Secondary | ICD-10-CM | POA: Diagnosis not present

## 2020-05-29 DIAGNOSIS — I10 Essential (primary) hypertension: Secondary | ICD-10-CM | POA: Diagnosis not present

## 2020-05-29 DIAGNOSIS — E1165 Type 2 diabetes mellitus with hyperglycemia: Secondary | ICD-10-CM | POA: Diagnosis not present

## 2020-06-27 DIAGNOSIS — E1165 Type 2 diabetes mellitus with hyperglycemia: Secondary | ICD-10-CM | POA: Diagnosis not present

## 2020-06-27 DIAGNOSIS — Z1389 Encounter for screening for other disorder: Secondary | ICD-10-CM | POA: Diagnosis not present

## 2020-06-27 DIAGNOSIS — G8190 Hemiplegia, unspecified affecting unspecified side: Secondary | ICD-10-CM | POA: Diagnosis not present

## 2020-06-27 DIAGNOSIS — I10 Essential (primary) hypertension: Secondary | ICD-10-CM | POA: Diagnosis not present

## 2020-06-27 DIAGNOSIS — Z0001 Encounter for general adult medical examination with abnormal findings: Secondary | ICD-10-CM | POA: Diagnosis not present

## 2020-06-27 DIAGNOSIS — E160 Drug-induced hypoglycemia without coma: Secondary | ICD-10-CM | POA: Diagnosis not present

## 2020-06-29 DIAGNOSIS — C678 Malignant neoplasm of overlapping sites of bladder: Secondary | ICD-10-CM | POA: Diagnosis not present

## 2020-06-29 DIAGNOSIS — R8271 Bacteriuria: Secondary | ICD-10-CM | POA: Diagnosis not present

## 2020-07-28 DIAGNOSIS — E1165 Type 2 diabetes mellitus with hyperglycemia: Secondary | ICD-10-CM | POA: Diagnosis not present

## 2020-07-28 DIAGNOSIS — I10 Essential (primary) hypertension: Secondary | ICD-10-CM | POA: Diagnosis not present

## 2020-08-27 DIAGNOSIS — I10 Essential (primary) hypertension: Secondary | ICD-10-CM | POA: Diagnosis not present

## 2020-08-27 DIAGNOSIS — E1165 Type 2 diabetes mellitus with hyperglycemia: Secondary | ICD-10-CM | POA: Diagnosis not present

## 2020-08-31 DIAGNOSIS — E119 Type 2 diabetes mellitus without complications: Secondary | ICD-10-CM | POA: Diagnosis not present

## 2020-08-31 DIAGNOSIS — Z7984 Long term (current) use of oral hypoglycemic drugs: Secondary | ICD-10-CM | POA: Diagnosis not present

## 2020-08-31 DIAGNOSIS — H401131 Primary open-angle glaucoma, bilateral, mild stage: Secondary | ICD-10-CM | POA: Diagnosis not present

## 2020-08-31 DIAGNOSIS — Z961 Presence of intraocular lens: Secondary | ICD-10-CM | POA: Diagnosis not present

## 2020-09-27 DIAGNOSIS — I10 Essential (primary) hypertension: Secondary | ICD-10-CM | POA: Diagnosis not present

## 2020-09-27 DIAGNOSIS — E1165 Type 2 diabetes mellitus with hyperglycemia: Secondary | ICD-10-CM | POA: Diagnosis not present

## 2020-10-29 DIAGNOSIS — E1165 Type 2 diabetes mellitus with hyperglycemia: Secondary | ICD-10-CM | POA: Diagnosis not present

## 2020-10-29 DIAGNOSIS — I10 Essential (primary) hypertension: Secondary | ICD-10-CM | POA: Diagnosis not present

## 2020-11-29 DIAGNOSIS — I10 Essential (primary) hypertension: Secondary | ICD-10-CM | POA: Diagnosis not present

## 2020-11-29 DIAGNOSIS — E1165 Type 2 diabetes mellitus with hyperglycemia: Secondary | ICD-10-CM | POA: Diagnosis not present

## 2021-01-01 DIAGNOSIS — G8190 Hemiplegia, unspecified affecting unspecified side: Secondary | ICD-10-CM | POA: Diagnosis not present

## 2021-01-01 DIAGNOSIS — Z23 Encounter for immunization: Secondary | ICD-10-CM | POA: Diagnosis not present

## 2021-01-01 DIAGNOSIS — M1990 Unspecified osteoarthritis, unspecified site: Secondary | ICD-10-CM | POA: Diagnosis not present

## 2021-01-01 DIAGNOSIS — E1165 Type 2 diabetes mellitus with hyperglycemia: Secondary | ICD-10-CM | POA: Diagnosis not present

## 2021-01-01 DIAGNOSIS — I10 Essential (primary) hypertension: Secondary | ICD-10-CM | POA: Diagnosis not present

## 2021-01-02 DIAGNOSIS — H401131 Primary open-angle glaucoma, bilateral, mild stage: Secondary | ICD-10-CM | POA: Diagnosis not present

## 2021-01-31 DIAGNOSIS — I10 Essential (primary) hypertension: Secondary | ICD-10-CM | POA: Diagnosis not present

## 2021-03-03 DIAGNOSIS — I10 Essential (primary) hypertension: Secondary | ICD-10-CM | POA: Diagnosis not present

## 2021-03-03 DIAGNOSIS — E1165 Type 2 diabetes mellitus with hyperglycemia: Secondary | ICD-10-CM | POA: Diagnosis not present

## 2021-04-02 DIAGNOSIS — I10 Essential (primary) hypertension: Secondary | ICD-10-CM | POA: Diagnosis not present

## 2021-04-02 DIAGNOSIS — E1165 Type 2 diabetes mellitus with hyperglycemia: Secondary | ICD-10-CM | POA: Diagnosis not present

## 2021-05-03 DIAGNOSIS — E1165 Type 2 diabetes mellitus with hyperglycemia: Secondary | ICD-10-CM | POA: Diagnosis not present

## 2021-05-03 DIAGNOSIS — I10 Essential (primary) hypertension: Secondary | ICD-10-CM | POA: Diagnosis not present

## 2021-05-10 DIAGNOSIS — H401131 Primary open-angle glaucoma, bilateral, mild stage: Secondary | ICD-10-CM | POA: Diagnosis not present

## 2021-06-03 DIAGNOSIS — E1165 Type 2 diabetes mellitus with hyperglycemia: Secondary | ICD-10-CM | POA: Diagnosis not present

## 2021-06-03 DIAGNOSIS — I10 Essential (primary) hypertension: Secondary | ICD-10-CM | POA: Diagnosis not present

## 2021-06-21 DIAGNOSIS — G8190 Hemiplegia, unspecified affecting unspecified side: Secondary | ICD-10-CM | POA: Diagnosis not present

## 2021-06-21 DIAGNOSIS — Z0001 Encounter for general adult medical examination with abnormal findings: Secondary | ICD-10-CM | POA: Diagnosis not present

## 2021-06-21 DIAGNOSIS — I10 Essential (primary) hypertension: Secondary | ICD-10-CM | POA: Diagnosis not present

## 2021-06-21 DIAGNOSIS — Z1389 Encounter for screening for other disorder: Secondary | ICD-10-CM | POA: Diagnosis not present

## 2021-06-21 DIAGNOSIS — E1165 Type 2 diabetes mellitus with hyperglycemia: Secondary | ICD-10-CM | POA: Diagnosis not present

## 2021-06-22 DIAGNOSIS — E1165 Type 2 diabetes mellitus with hyperglycemia: Secondary | ICD-10-CM | POA: Diagnosis not present

## 2021-06-22 DIAGNOSIS — Z0001 Encounter for general adult medical examination with abnormal findings: Secondary | ICD-10-CM | POA: Diagnosis not present

## 2021-07-06 DIAGNOSIS — C678 Malignant neoplasm of overlapping sites of bladder: Secondary | ICD-10-CM | POA: Diagnosis not present

## 2021-07-22 DIAGNOSIS — I10 Essential (primary) hypertension: Secondary | ICD-10-CM | POA: Diagnosis not present

## 2021-07-22 DIAGNOSIS — E1165 Type 2 diabetes mellitus with hyperglycemia: Secondary | ICD-10-CM | POA: Diagnosis not present

## 2021-08-27 DIAGNOSIS — I10 Essential (primary) hypertension: Secondary | ICD-10-CM | POA: Diagnosis not present

## 2021-08-27 DIAGNOSIS — E1165 Type 2 diabetes mellitus with hyperglycemia: Secondary | ICD-10-CM | POA: Diagnosis not present

## 2021-09-10 DIAGNOSIS — Z961 Presence of intraocular lens: Secondary | ICD-10-CM | POA: Diagnosis not present

## 2021-09-10 DIAGNOSIS — H401131 Primary open-angle glaucoma, bilateral, mild stage: Secondary | ICD-10-CM | POA: Diagnosis not present

## 2021-09-10 DIAGNOSIS — Z7984 Long term (current) use of oral hypoglycemic drugs: Secondary | ICD-10-CM | POA: Diagnosis not present

## 2021-09-10 DIAGNOSIS — E119 Type 2 diabetes mellitus without complications: Secondary | ICD-10-CM | POA: Diagnosis not present

## 2021-09-27 DIAGNOSIS — E1165 Type 2 diabetes mellitus with hyperglycemia: Secondary | ICD-10-CM | POA: Diagnosis not present

## 2021-09-27 DIAGNOSIS — I10 Essential (primary) hypertension: Secondary | ICD-10-CM | POA: Diagnosis not present

## 2021-10-27 DIAGNOSIS — I10 Essential (primary) hypertension: Secondary | ICD-10-CM | POA: Diagnosis not present

## 2021-10-27 DIAGNOSIS — E1165 Type 2 diabetes mellitus with hyperglycemia: Secondary | ICD-10-CM | POA: Diagnosis not present

## 2021-11-27 DIAGNOSIS — I10 Essential (primary) hypertension: Secondary | ICD-10-CM | POA: Diagnosis not present

## 2021-11-27 DIAGNOSIS — E1165 Type 2 diabetes mellitus with hyperglycemia: Secondary | ICD-10-CM | POA: Diagnosis not present

## 2022-01-15 DIAGNOSIS — H409 Unspecified glaucoma: Secondary | ICD-10-CM | POA: Diagnosis not present

## 2022-01-15 DIAGNOSIS — E1165 Type 2 diabetes mellitus with hyperglycemia: Secondary | ICD-10-CM | POA: Diagnosis not present

## 2022-01-15 DIAGNOSIS — M1990 Unspecified osteoarthritis, unspecified site: Secondary | ICD-10-CM | POA: Diagnosis not present

## 2022-01-15 DIAGNOSIS — H401131 Primary open-angle glaucoma, bilateral, mild stage: Secondary | ICD-10-CM | POA: Diagnosis not present

## 2022-01-15 DIAGNOSIS — Z23 Encounter for immunization: Secondary | ICD-10-CM | POA: Diagnosis not present

## 2022-01-15 DIAGNOSIS — I10 Essential (primary) hypertension: Secondary | ICD-10-CM | POA: Diagnosis not present

## 2022-02-17 DIAGNOSIS — E1165 Type 2 diabetes mellitus with hyperglycemia: Secondary | ICD-10-CM | POA: Diagnosis not present

## 2022-02-17 DIAGNOSIS — I10 Essential (primary) hypertension: Secondary | ICD-10-CM | POA: Diagnosis not present

## 2022-03-21 DIAGNOSIS — E1165 Type 2 diabetes mellitus with hyperglycemia: Secondary | ICD-10-CM | POA: Diagnosis not present

## 2022-03-21 DIAGNOSIS — I10 Essential (primary) hypertension: Secondary | ICD-10-CM | POA: Diagnosis not present

## 2022-04-20 DIAGNOSIS — E1165 Type 2 diabetes mellitus with hyperglycemia: Secondary | ICD-10-CM | POA: Diagnosis not present

## 2022-04-20 DIAGNOSIS — I10 Essential (primary) hypertension: Secondary | ICD-10-CM | POA: Diagnosis not present

## 2022-05-21 DIAGNOSIS — I10 Essential (primary) hypertension: Secondary | ICD-10-CM | POA: Diagnosis not present

## 2022-05-21 DIAGNOSIS — E1165 Type 2 diabetes mellitus with hyperglycemia: Secondary | ICD-10-CM | POA: Diagnosis not present

## 2022-05-21 DIAGNOSIS — R2243 Localized swelling, mass and lump, lower limb, bilateral: Secondary | ICD-10-CM | POA: Diagnosis not present

## 2022-05-21 DIAGNOSIS — G8192 Hemiplegia, unspecified affecting left dominant side: Secondary | ICD-10-CM | POA: Diagnosis not present

## 2022-06-20 DIAGNOSIS — E1165 Type 2 diabetes mellitus with hyperglycemia: Secondary | ICD-10-CM | POA: Diagnosis not present

## 2022-06-20 DIAGNOSIS — I10 Essential (primary) hypertension: Secondary | ICD-10-CM | POA: Diagnosis not present

## 2022-07-02 DIAGNOSIS — C678 Malignant neoplasm of overlapping sites of bladder: Secondary | ICD-10-CM | POA: Diagnosis not present

## 2022-07-11 DIAGNOSIS — G8192 Hemiplegia, unspecified affecting left dominant side: Secondary | ICD-10-CM | POA: Diagnosis not present

## 2022-07-11 DIAGNOSIS — M1990 Unspecified osteoarthritis, unspecified site: Secondary | ICD-10-CM | POA: Diagnosis not present

## 2022-07-11 DIAGNOSIS — Z0001 Encounter for general adult medical examination with abnormal findings: Secondary | ICD-10-CM | POA: Diagnosis not present

## 2022-07-11 DIAGNOSIS — Z7189 Other specified counseling: Secondary | ICD-10-CM | POA: Diagnosis not present

## 2022-07-11 DIAGNOSIS — E1165 Type 2 diabetes mellitus with hyperglycemia: Secondary | ICD-10-CM | POA: Diagnosis not present

## 2022-07-11 DIAGNOSIS — H409 Unspecified glaucoma: Secondary | ICD-10-CM | POA: Diagnosis not present

## 2022-07-11 DIAGNOSIS — Z1389 Encounter for screening for other disorder: Secondary | ICD-10-CM | POA: Diagnosis not present

## 2022-07-11 DIAGNOSIS — I10 Essential (primary) hypertension: Secondary | ICD-10-CM | POA: Diagnosis not present

## 2022-07-12 DIAGNOSIS — Z0001 Encounter for general adult medical examination with abnormal findings: Secondary | ICD-10-CM | POA: Diagnosis not present

## 2022-07-12 DIAGNOSIS — E1165 Type 2 diabetes mellitus with hyperglycemia: Secondary | ICD-10-CM | POA: Diagnosis not present

## 2022-07-12 DIAGNOSIS — I10 Essential (primary) hypertension: Secondary | ICD-10-CM | POA: Diagnosis not present

## 2022-07-12 DIAGNOSIS — Z79899 Other long term (current) drug therapy: Secondary | ICD-10-CM | POA: Diagnosis not present

## 2022-08-11 DIAGNOSIS — I10 Essential (primary) hypertension: Secondary | ICD-10-CM | POA: Diagnosis not present

## 2022-08-11 DIAGNOSIS — E1165 Type 2 diabetes mellitus with hyperglycemia: Secondary | ICD-10-CM | POA: Diagnosis not present

## 2022-09-10 DIAGNOSIS — I10 Essential (primary) hypertension: Secondary | ICD-10-CM | POA: Diagnosis not present

## 2022-09-10 DIAGNOSIS — E1165 Type 2 diabetes mellitus with hyperglycemia: Secondary | ICD-10-CM | POA: Diagnosis not present

## 2022-10-11 DIAGNOSIS — E1165 Type 2 diabetes mellitus with hyperglycemia: Secondary | ICD-10-CM | POA: Diagnosis not present

## 2022-10-11 DIAGNOSIS — I10 Essential (primary) hypertension: Secondary | ICD-10-CM | POA: Diagnosis not present

## 2022-10-29 DIAGNOSIS — K922 Gastrointestinal hemorrhage, unspecified: Secondary | ICD-10-CM | POA: Diagnosis not present

## 2022-10-29 DIAGNOSIS — Z289 Immunization not carried out for unspecified reason: Secondary | ICD-10-CM | POA: Diagnosis not present

## 2022-10-29 DIAGNOSIS — Z0001 Encounter for general adult medical examination with abnormal findings: Secondary | ICD-10-CM | POA: Diagnosis not present

## 2022-10-29 DIAGNOSIS — G8192 Hemiplegia, unspecified affecting left dominant side: Secondary | ICD-10-CM | POA: Diagnosis not present

## 2022-10-29 DIAGNOSIS — E1165 Type 2 diabetes mellitus with hyperglycemia: Secondary | ICD-10-CM | POA: Diagnosis not present

## 2022-10-29 DIAGNOSIS — I10 Essential (primary) hypertension: Secondary | ICD-10-CM | POA: Diagnosis not present

## 2022-10-30 DIAGNOSIS — E119 Type 2 diabetes mellitus without complications: Secondary | ICD-10-CM | POA: Diagnosis not present

## 2022-10-30 DIAGNOSIS — H18413 Arcus senilis, bilateral: Secondary | ICD-10-CM | POA: Diagnosis not present

## 2022-10-30 DIAGNOSIS — H401134 Primary open-angle glaucoma, bilateral, indeterminate stage: Secondary | ICD-10-CM | POA: Diagnosis not present

## 2022-10-30 DIAGNOSIS — H43813 Vitreous degeneration, bilateral: Secondary | ICD-10-CM | POA: Diagnosis not present

## 2022-11-11 ENCOUNTER — Ambulatory Visit: Payer: Medicare Other | Admitting: Gastroenterology

## 2022-11-11 ENCOUNTER — Encounter: Payer: Self-pay | Admitting: Gastroenterology

## 2022-11-11 VITALS — BP 131/69 | HR 66 | Temp 97.9°F | Ht 67.0 in | Wt 253.8 lb

## 2022-11-11 DIAGNOSIS — R195 Other fecal abnormalities: Secondary | ICD-10-CM

## 2022-11-11 DIAGNOSIS — K5900 Constipation, unspecified: Secondary | ICD-10-CM | POA: Diagnosis not present

## 2022-11-11 DIAGNOSIS — R1012 Left upper quadrant pain: Secondary | ICD-10-CM

## 2022-11-11 NOTE — Patient Instructions (Addendum)
I want you to complete the stool cards at home and then bring them back to the office.  This is to assess for blood in your stool.  Please let me know if constipation returns.  If so you may use MiraLAX or Dulcolax as needed.  If pain in your left upper abdomen does not continue to improve you can start using Pepcid daily.  If you develop a darker stools again, bright red blood in your stool, or if you are stool testing is positive then we may consider repeat colonoscopy.   Happy early birthday!  It was a pleasure to meet you today!  Continue to follow as needed.  Please do not hesitate to reach out if you have any questions or concerns.  I want to create trusting relationships with patients. If you receive a survey regarding your visit,  I greatly appreciate you taking time to fill this out on paper or through your MyChart. I value your feedback.  Brooke Bonito, MSN, FNP-BC, AGACNP-BC [facility]

## 2022-11-11 NOTE — Progress Notes (Signed)
GI Office Note    Referring Provider: Benetta Spar* Primary Care Physician:  Benetta Spar, MD  Primary Gastroenterologist: Gerrit Friends.Rourk, MD   Chief Complaint   Chief Complaint  Patient presents with   Constipation    Constipation and dark stools. Pain on left lower side.    History of Present Illness   [name] is a 82 y.o. male presenting today at the request of Fanta, Tesfaye Demissie* for constipation and dark stools.  Last seen in 2019 to discuss colonoscopy.  He denied any rectal bleeding, change in bowel habits, constipation, diarrhea, abdominal pain, nausea/vomiting, or dysphagia.  His last colonoscopy was noted to be in 2014.  He was scheduled for colonoscopy at that time.  Colonoscopy October 2019: -Nonbleeding internal hemorrhoids -5 mm polyp in sigmoid colon -Polyp revealed to be hyperplastic. -Future colonoscopy not recommended unless new symptoms develop.  Today: Constipation lasted for about 3 weeks but has since resolved. He was having some left sided abdominal pain at the time but is very mild right now and continuing to resolve. Is going once per day right now, usually once per day upon waking. Does sometimes have to strain but that is not out of the ordinary for him.   Works out in his garden regularly.  Denies any lightheadedness, dizziness, or syncope.  He stated his stool was very black at the time with foul odor but that has also cleared up. No brbpr. Denied pepto use at the time.took some dulcolax to help with constipation at that time.   Does not take prilosec. Denies heartburn, N/V, dysphagia, lakc of appetite.   Current Outpatient Medications  Medication Sig Dispense Refill   amLODipine (NORVASC) 10 MG tablet Take 10 mg by mouth daily.      aspirin 325 MG tablet Take 325 mg by mouth daily.     cloNIDine (CATAPRES) 0.2 MG tablet Take 0.2 mg by mouth 2 (two) times daily.      clopidogrel (PLAVIX) 75 MG tablet Take 75  mg by mouth daily.     hydrALAZINE (APRESOLINE) 50 MG tablet Take 50 mg by mouth 3 (three) times daily.      ibuprofen (ADVIL,MOTRIN) 200 MG tablet Take 200 mg by mouth every 6 (six) hours as needed for moderate pain.      lisinopril (PRINIVIL,ZESTRIL) 40 MG tablet Take 40 mg by mouth daily.      LUMIGAN 0.01 % SOLN Place 1 drop into both eyes at bedtime.  3   metFORMIN (GLUCOPHAGE) 500 MG tablet Take 500 mg by mouth 2 (two) times daily.  4   Omega-3 Fatty Acids (FISH OIL) 1000 MG CAPS Take 1 capsule by mouth daily.     spironolactone (ALDACTONE) 25 MG tablet Take 25 mg by mouth daily.   3   traMADol (ULTRAM) 50 MG tablet Take 50 mg by mouth 2 (two) times daily.      furosemide (LASIX) 20 MG tablet Take 20 mg by mouth 3 (three) times daily.  (Patient not taking: Reported on 11/11/2022)     omeprazole (PRILOSEC) 20 MG capsule Take 20 mg by mouth daily.  (Patient not taking: Reported on 11/11/2022)     No current facility-administered medications for this visit.    Past Medical History:  Diagnosis Date   Arthritis    Bladder cancer (HCC)    Diabetes (HCC)    GERD (gastroesophageal reflux disease)    Glaucoma    Hypertension    Prostate  cancer [facility])    Stroke (HCC) 3/09   slight left sided weakness    Past Surgical History:  Procedure Laterality Date   BACK SURGERY  1996   ruptured disc   CATARACT EXTRACTION W/PHACO Right 11/14/2015   Procedure: CATARACT EXTRACTION PHACO AND INTRAOCULAR LENS PLACEMENT (IOC);  Surgeon: Jethro Bolus, MD;  Location: [facility];  Service: Ophthalmology;  Laterality: Right;  CDE: 10.36   CATARACT EXTRACTION W/PHACO Left 12/12/2015   Procedure: CATARACT EXTRACTION PHACO AND INTRAOCULAR LENS PLACEMENT LEFT EYE CDE=7.59;  Surgeon: Jethro Bolus, MD;  Location: [facility];  Service: Ophthalmology;  Laterality: Left;   COLONOSCOPY  12/23/2007   RMR: Normal rectum.  Pedunculated polyp, distal transverse colon, status post hot snare removal.  Remainder of colonic mucosa and  terminal mucosa appeared normal. path with multiple fragments of tubular adenoma   COLONOSCOPY  12/08/2002   JYN:[id number] internal hemorrhoids, otherwise, normal colonoscopy   COLONOSCOPY  2001   per Dr. Patty Sermons note in 2004, large tubulovillous adenoma   COLONOSCOPY N/A 11/20/2012   [id number] diverticulosis. Colonic polyps-removed as described above. I suspect benign anorectal bleeding.  tubular adenomas. next TCS 11/2017.   COLONOSCOPY WITH PROPOFOL N/A 02/02/2018   Procedure: COLONOSCOPY WITH PROPOFOL;  Surgeon: Corbin Ade, MD;  Location: [facility];  Service: Endoscopy;  Laterality: N/A;  10:45am   HEMORRHOID BANDING  05/13/2013   POLYPECTOMY  02/02/2018   Procedure: POLYPECTOMY;  Surgeon: Corbin Ade, MD;  Location: [facility];  Service: Endoscopy;;  colon   PROSTATE SURGERY  2000    Family History  Problem Relation Age of Onset   Colon cancer Neg Hx     Allergies as of 11/11/2022   (No Known Allergies)    Social History   Socioeconomic History   Marital status: Married    Spouse name: Not on file   Number of children: Not on file   Years of education: Not on file   Highest education level: Not on file  Occupational History   Not on file  Tobacco Use   Smoking status: Former    Current packs/day: 0.00    Average packs/day: 0.5 packs/day for 30.0 years (15.0 ttl pk-yrs)    Types: Cigarettes    Start date: 11/07/1977    Quit date: 11/08/2007    Years since quitting: 15.0   Smokeless tobacco: Never   Tobacco comments:    quit in 2009  Vaping Use   Vaping status: Never Used  Substance and Sexual Activity   Alcohol use: Yes    Comment: rare   Drug use: No   Sexual activity: Never    Birth control/protection: None  Other Topics Concern   Not on file  Social History Narrative   Not on file   Social Determinants of Health   Financial Resource Strain: Not on file  Food Insecurity: Not on file  Transportation Needs: Not on file  Physical Activity:  Not on file  Stress: Not on file  Social Connections: Not on file  Intimate Partner Violence: Not on file   Review of Systems   Gen: Denies any fever, chills, fatigue, weight loss, lack of appetite.  CV: Denies chest pain, heart palpitations, peripheral edema, syncope.  Resp: Denies shortness of breath at rest or with exertion. Denies wheezing or cough.  GI: see HPI GU : Denies urinary burning, urinary frequency, urinary hesitancy MS: Denies joint pain, muscle weakness, cramps, or limitation of movement.  Derm: Denies rash, itching, dry skin  Psych: Denies depression, anxiety, memory loss, and confusion Heme: Denies bruising, bleeding, and enlarged lymph nodes.   Physical Exam   BP 131/69 (BP Location: Right Arm, Patient Position: Sitting, Cuff Size: Large)   Pulse 66   Temp 97.9 F (36.6 C) (Temporal)   Ht 5\' 7"  (1.702 m)   Wt 253 lb 12.8 oz (115.1 kg)   SpO2 96%   BMI 39.75 kg/m   General:   Alert and oriented. Pleasant and cooperative. Well-nourished and well-developed.  Head:  Normocephalic and atraumatic. Eyes:  Without icterus, sclera clear and conjunctiva pink.  Ears:  Normal auditory acuity. Mouth:  No deformity or lesions, oral mucosa pink.  Lungs:  Clear to auscultation bilaterally. No wheezes, rales, or rhonchi. No distress.  Heart:  S1, S2 present without murmurs appreciated.  Abdomen:  +BS, soft, rounded. Mild ttp to LUQ. No HSM noted. No guarding or rebound. No masses appreciated.  Rectal:  Deferred  Msk:  Normal posture. Weak gait. Uses single point cane.  Extremities:  Without edema. Neurologic:  Alert and  oriented x4;  grossly normal neurologically. Skin:  Intact without significant lesions or rashes. Psych:  Alert and cooperative. Normal mood and affect.   Assessment   [name] is a 82 y.o. male with a history of GERD, HTN, prostate cancer, bladder cancer, diabetes, and stroke in 2009 presenting today for evaluation of constipation and dark  stools.  LUQ pain, Dark stools: During his constipation he reports some very dark stools.  Not described as tarry but very dark.  Denied any iron consumption or Pepto-Bismol use.  Denies any heartburn or midepigastric pain.  As noted below he has had some mild left upper quadrant discomfort/pain.  Unclear how long he has been off PPI but denies any overt reflux symptoms, nausea, vomiting, or dysphagia.  Mildly tender on exam today to LUQ.  Although he has had this pain he says it is continuing to improve and I have recommended that if pain were to continue that he could use Pepcid for any atypical reflux.  We will check FOBT x 2 and if presence of blood noted then we will consider checking additional lab work and discuss scheduling colonoscopy.   Constipation: Patient reported symptoms lasted for about 3 weeks and then have recently resolved.  Currently having daily bowel movements that are normal and brown in color.  Does have some mild straining but reports this is baseline for him and no acute change.  Denies any lower abdominal pain, pain is described as being more in the left upper quadrant.  Denies any rectal pain, melena, or hematochezia.  Used Dulcolax briefly during constipation bout.  Advised he may continue to use Dulcolax or MiraLAX as needed for any constipation and to let me know if this returns.  If constipation returns we can consider repeating colonoscopy.  PLAN   Complete FOBT x 2 Dulcolax or miralax as needed for constipation May use pepcid as needed if ongoing LUQ discomfort May consider colonoscopy if FOBT positive as well as checking CBC. Follow up as needed   Brooke Bonito, MSN, FNP-BC, AGACNP-BC [facility]

## 2022-11-12 ENCOUNTER — Encounter: Payer: Self-pay | Admitting: Gastroenterology

## 2022-11-14 ENCOUNTER — Other Ambulatory Visit: Payer: Self-pay | Admitting: *Deleted

## 2022-11-14 ENCOUNTER — Ambulatory Visit (INDEPENDENT_AMBULATORY_CARE_PROVIDER_SITE_OTHER): Payer: Medicare Other | Admitting: Gastroenterology

## 2022-11-14 DIAGNOSIS — R195 Other fecal abnormalities: Secondary | ICD-10-CM

## 2022-11-14 LAB — IFOBT (OCCULT BLOOD): IFOBT: NEGATIVE

## 2022-11-29 DIAGNOSIS — E1165 Type 2 diabetes mellitus with hyperglycemia: Secondary | ICD-10-CM | POA: Diagnosis not present

## 2022-11-29 DIAGNOSIS — I10 Essential (primary) hypertension: Secondary | ICD-10-CM | POA: Diagnosis not present

## 2023-01-01 DIAGNOSIS — E1165 Type 2 diabetes mellitus with hyperglycemia: Secondary | ICD-10-CM | POA: Diagnosis not present

## 2023-01-01 DIAGNOSIS — H409 Unspecified glaucoma: Secondary | ICD-10-CM | POA: Diagnosis not present

## 2023-01-01 DIAGNOSIS — G8192 Hemiplegia, unspecified affecting left dominant side: Secondary | ICD-10-CM | POA: Diagnosis not present

## 2023-01-01 DIAGNOSIS — Z23 Encounter for immunization: Secondary | ICD-10-CM | POA: Diagnosis not present

## 2023-01-01 DIAGNOSIS — I10 Essential (primary) hypertension: Secondary | ICD-10-CM | POA: Diagnosis not present

## 2023-02-08 DIAGNOSIS — E1165 Type 2 diabetes mellitus with hyperglycemia: Secondary | ICD-10-CM | POA: Diagnosis not present

## 2023-02-08 DIAGNOSIS — I10 Essential (primary) hypertension: Secondary | ICD-10-CM | POA: Diagnosis not present

## 2023-02-12 DIAGNOSIS — H401134 Primary open-angle glaucoma, bilateral, indeterminate stage: Secondary | ICD-10-CM | POA: Diagnosis not present

## 2023-02-12 DIAGNOSIS — H43813 Vitreous degeneration, bilateral: Secondary | ICD-10-CM | POA: Diagnosis not present

## 2023-02-12 DIAGNOSIS — H18413 Arcus senilis, bilateral: Secondary | ICD-10-CM | POA: Diagnosis not present

## 2023-03-11 DIAGNOSIS — E1165 Type 2 diabetes mellitus with hyperglycemia: Secondary | ICD-10-CM | POA: Diagnosis not present

## 2023-03-11 DIAGNOSIS — I10 Essential (primary) hypertension: Secondary | ICD-10-CM | POA: Diagnosis not present

## 2023-04-10 DIAGNOSIS — I10 Essential (primary) hypertension: Secondary | ICD-10-CM | POA: Diagnosis not present

## 2023-04-10 DIAGNOSIS — E1165 Type 2 diabetes mellitus with hyperglycemia: Secondary | ICD-10-CM | POA: Diagnosis not present

## 2023-05-11 DIAGNOSIS — I10 Essential (primary) hypertension: Secondary | ICD-10-CM | POA: Diagnosis not present

## 2023-05-11 DIAGNOSIS — E1165 Type 2 diabetes mellitus with hyperglycemia: Secondary | ICD-10-CM | POA: Diagnosis not present

## 2023-05-26 ENCOUNTER — Ambulatory Visit (HOSPITAL_COMMUNITY)
Admission: RE | Admit: 2023-05-26 | Discharge: 2023-05-26 | Disposition: A | Discharge status: Home / Self Care | Payer: Medicare Other | Source: Ambulatory Visit | Attending: Gerontology | Admitting: Gerontology

## 2023-05-26 ENCOUNTER — Other Ambulatory Visit (HOSPITAL_COMMUNITY): Payer: Self-pay | Admitting: Gerontology

## 2023-05-26 DIAGNOSIS — M545 Low back pain, unspecified: Secondary | ICD-10-CM

## 2023-05-26 DIAGNOSIS — M5451 Vertebrogenic low back pain: Secondary | ICD-10-CM | POA: Diagnosis not present

## 2023-05-26 DIAGNOSIS — M47816 Spondylosis without myelopathy or radiculopathy, lumbar region: Secondary | ICD-10-CM | POA: Diagnosis not present

## 2023-05-26 DIAGNOSIS — M5136 Other intervertebral disc degeneration, lumbar region with discogenic back pain only: Secondary | ICD-10-CM | POA: Diagnosis not present

## 2023-05-26 DIAGNOSIS — G8192 Hemiplegia, unspecified affecting left dominant side: Secondary | ICD-10-CM | POA: Diagnosis not present

## 2023-05-26 DIAGNOSIS — E1165 Type 2 diabetes mellitus with hyperglycemia: Secondary | ICD-10-CM | POA: Diagnosis not present

## 2023-05-26 DIAGNOSIS — I10 Essential (primary) hypertension: Secondary | ICD-10-CM | POA: Diagnosis not present

## 2023-07-09 DIAGNOSIS — H409 Unspecified glaucoma: Secondary | ICD-10-CM | POA: Diagnosis not present

## 2023-07-09 DIAGNOSIS — M1990 Unspecified osteoarthritis, unspecified site: Secondary | ICD-10-CM | POA: Diagnosis not present

## 2023-07-09 DIAGNOSIS — Z1389 Encounter for screening for other disorder: Secondary | ICD-10-CM | POA: Diagnosis not present

## 2023-07-09 DIAGNOSIS — I10 Essential (primary) hypertension: Secondary | ICD-10-CM | POA: Diagnosis not present

## 2023-07-09 DIAGNOSIS — E1165 Type 2 diabetes mellitus with hyperglycemia: Secondary | ICD-10-CM | POA: Diagnosis not present

## 2023-07-09 DIAGNOSIS — G8192 Hemiplegia, unspecified affecting left dominant side: Secondary | ICD-10-CM | POA: Diagnosis not present

## 2023-07-09 DIAGNOSIS — Z0001 Encounter for general adult medical examination with abnormal findings: Secondary | ICD-10-CM | POA: Diagnosis not present

## 2023-07-10 DIAGNOSIS — E1165 Type 2 diabetes mellitus with hyperglycemia: Secondary | ICD-10-CM | POA: Diagnosis not present

## 2023-07-10 DIAGNOSIS — I10 Essential (primary) hypertension: Secondary | ICD-10-CM | POA: Diagnosis not present

## 2023-07-10 DIAGNOSIS — Z0001 Encounter for general adult medical examination with abnormal findings: Secondary | ICD-10-CM | POA: Diagnosis not present

## 2023-07-23 DIAGNOSIS — C678 Malignant neoplasm of overlapping sites of bladder: Secondary | ICD-10-CM | POA: Diagnosis not present

## 2023-08-09 DIAGNOSIS — E1165 Type 2 diabetes mellitus with hyperglycemia: Secondary | ICD-10-CM | POA: Diagnosis not present

## 2023-08-09 DIAGNOSIS — I10 Essential (primary) hypertension: Secondary | ICD-10-CM | POA: Diagnosis not present

## 2023-08-13 DIAGNOSIS — H401134 Primary open-angle glaucoma, bilateral, indeterminate stage: Secondary | ICD-10-CM | POA: Diagnosis not present

## 2023-08-13 DIAGNOSIS — Z961 Presence of intraocular lens: Secondary | ICD-10-CM | POA: Diagnosis not present

## 2023-08-13 DIAGNOSIS — E119 Type 2 diabetes mellitus without complications: Secondary | ICD-10-CM | POA: Diagnosis not present

## 2023-08-13 DIAGNOSIS — H43813 Vitreous degeneration, bilateral: Secondary | ICD-10-CM | POA: Diagnosis not present

## 2023-08-13 DIAGNOSIS — H18413 Arcus senilis, bilateral: Secondary | ICD-10-CM | POA: Diagnosis not present

## 2023-09-08 DIAGNOSIS — E1165 Type 2 diabetes mellitus with hyperglycemia: Secondary | ICD-10-CM | POA: Diagnosis not present

## 2023-09-08 DIAGNOSIS — I10 Essential (primary) hypertension: Secondary | ICD-10-CM | POA: Diagnosis not present

## 2023-10-09 DIAGNOSIS — I10 Essential (primary) hypertension: Secondary | ICD-10-CM | POA: Diagnosis not present

## 2023-10-09 DIAGNOSIS — E1165 Type 2 diabetes mellitus with hyperglycemia: Secondary | ICD-10-CM | POA: Diagnosis not present

## 2023-12-09 DIAGNOSIS — E1165 Type 2 diabetes mellitus with hyperglycemia: Secondary | ICD-10-CM | POA: Diagnosis not present

## 2023-12-09 DIAGNOSIS — I10 Essential (primary) hypertension: Secondary | ICD-10-CM | POA: Diagnosis not present

## 2023-12-29 DIAGNOSIS — M1990 Unspecified osteoarthritis, unspecified site: Secondary | ICD-10-CM | POA: Diagnosis not present

## 2023-12-29 DIAGNOSIS — Z23 Encounter for immunization: Secondary | ICD-10-CM | POA: Diagnosis not present

## 2023-12-29 DIAGNOSIS — I10 Essential (primary) hypertension: Secondary | ICD-10-CM | POA: Diagnosis not present

## 2023-12-29 DIAGNOSIS — E1165 Type 2 diabetes mellitus with hyperglycemia: Secondary | ICD-10-CM | POA: Diagnosis not present

## 2024-01-28 DIAGNOSIS — E1165 Type 2 diabetes mellitus with hyperglycemia: Secondary | ICD-10-CM | POA: Diagnosis not present

## 2024-01-28 DIAGNOSIS — I10 Essential (primary) hypertension: Secondary | ICD-10-CM | POA: Diagnosis not present
